# Patient Record
Sex: Male | Born: 1970
Health system: Southern US, Community
[De-identification: ages and names within clinical notes are randomized; demographics above are authoritative.]

## PROBLEM LIST (undated history)

## (undated) DIAGNOSIS — F17200 Nicotine dependence, unspecified, uncomplicated: Secondary | ICD-10-CM

## (undated) DIAGNOSIS — K429 Umbilical hernia without obstruction or gangrene: Secondary | ICD-10-CM

## (undated) DIAGNOSIS — F39 Unspecified mood [affective] disorder: Secondary | ICD-10-CM

## (undated) DIAGNOSIS — T7840XA Allergy, unspecified, initial encounter: Secondary | ICD-10-CM

## (undated) DIAGNOSIS — G510 Bell's palsy: Secondary | ICD-10-CM

## (undated) DIAGNOSIS — Z91030 Bee allergy status: Secondary | ICD-10-CM

## (undated) HISTORY — DX: Bell's palsy: G51.0

## (undated) HISTORY — PX: EYE SURGERY: SHX253

## (undated) HISTORY — DX: Allergy, unspecified, initial encounter: T78.40XA

## (undated) HISTORY — DX: Nicotine dependence, unspecified, uncomplicated: F17.200

## (undated) HISTORY — PX: WRIST SURGERY: SHX841

## (undated) HISTORY — DX: Umbilical hernia without obstruction or gangrene: K42.9

---

## 2001-12-29 ENCOUNTER — Emergency Department (HOSPITAL_COMMUNITY): Admission: EM | Admit: 2001-12-29 | Discharge: 2001-12-29 | Payer: Self-pay

## 2002-09-18 ENCOUNTER — Emergency Department (HOSPITAL_COMMUNITY): Admission: EM | Admit: 2002-09-18 | Discharge: 2002-09-18 | Payer: Self-pay | Admitting: Emergency Medicine

## 2002-09-24 ENCOUNTER — Emergency Department (HOSPITAL_COMMUNITY): Admission: EM | Admit: 2002-09-24 | Discharge: 2002-09-25 | Payer: Self-pay | Admitting: Emergency Medicine

## 2003-03-05 ENCOUNTER — Encounter: Payer: Self-pay | Admitting: Emergency Medicine

## 2003-03-05 ENCOUNTER — Emergency Department (HOSPITAL_COMMUNITY): Admission: EM | Admit: 2003-03-05 | Discharge: 2003-03-05 | Payer: Self-pay | Admitting: Emergency Medicine

## 2003-07-19 ENCOUNTER — Emergency Department (HOSPITAL_COMMUNITY): Admission: EM | Admit: 2003-07-19 | Discharge: 2003-07-19 | Payer: Self-pay | Admitting: Emergency Medicine

## 2005-11-21 ENCOUNTER — Emergency Department (HOSPITAL_COMMUNITY): Admission: EM | Admit: 2005-11-21 | Discharge: 2005-11-21 | Payer: Self-pay | Admitting: Emergency Medicine

## 2006-02-27 ENCOUNTER — Ambulatory Visit: Payer: Self-pay | Admitting: Family Medicine

## 2006-04-10 ENCOUNTER — Ambulatory Visit: Payer: Self-pay | Admitting: Family Medicine

## 2006-04-20 ENCOUNTER — Ambulatory Visit: Payer: Self-pay | Admitting: Family Medicine

## 2006-05-15 ENCOUNTER — Ambulatory Visit: Payer: Self-pay | Admitting: Internal Medicine

## 2006-05-22 ENCOUNTER — Encounter (INDEPENDENT_AMBULATORY_CARE_PROVIDER_SITE_OTHER): Payer: Self-pay | Admitting: Specialist

## 2006-05-22 ENCOUNTER — Ambulatory Visit: Payer: Self-pay | Admitting: Internal Medicine

## 2006-05-22 LAB — HM COLONOSCOPY: HM Colonoscopy: NORMAL

## 2007-02-02 ENCOUNTER — Emergency Department (HOSPITAL_COMMUNITY): Admission: EM | Admit: 2007-02-02 | Discharge: 2007-02-02 | Payer: Self-pay | Admitting: Emergency Medicine

## 2007-02-26 ENCOUNTER — Ambulatory Visit: Payer: Self-pay | Admitting: Family Medicine

## 2007-10-08 ENCOUNTER — Ambulatory Visit: Payer: Self-pay | Admitting: Family Medicine

## 2007-12-13 ENCOUNTER — Ambulatory Visit: Payer: Self-pay | Admitting: Family Medicine

## 2009-06-24 ENCOUNTER — Emergency Department (HOSPITAL_COMMUNITY): Admission: EM | Admit: 2009-06-24 | Discharge: 2009-06-24 | Payer: Self-pay | Admitting: Emergency Medicine

## 2009-11-19 ENCOUNTER — Ambulatory Visit: Payer: Self-pay | Admitting: Family Medicine

## 2009-12-13 ENCOUNTER — Ambulatory Visit: Payer: Self-pay | Admitting: Family Medicine

## 2010-01-11 ENCOUNTER — Ambulatory Visit: Payer: Self-pay | Admitting: Family Medicine

## 2010-05-07 ENCOUNTER — Ambulatory Visit: Payer: Self-pay | Admitting: Family Medicine

## 2010-08-19 ENCOUNTER — Ambulatory Visit: Payer: Self-pay | Admitting: Family Medicine

## 2010-09-13 ENCOUNTER — Ambulatory Visit
Admission: RE | Admit: 2010-09-13 | Discharge: 2010-09-13 | Payer: Self-pay | Source: Home / Self Care | Attending: Family Medicine | Admitting: Family Medicine

## 2010-10-02 ENCOUNTER — Ambulatory Visit
Admission: RE | Admit: 2010-10-02 | Discharge: 2010-10-02 | Payer: Self-pay | Source: Home / Self Care | Attending: Orthopedic Surgery | Admitting: Orthopedic Surgery

## 2010-10-02 LAB — POCT HEMOGLOBIN-HEMACUE: Hemoglobin: 15.3 g/dL (ref 13.0–17.0)

## 2010-10-28 NOTE — Op Note (Signed)
NAME:  Mark Macias, Mark Macias                  ACCOUNT NO.:  [id number]  MEDICAL RECORD NO.:  [id number]          PATIENT TYPE:  AMB  LOCATION:  DSC                          FACILITY:  [facility]  PHYSICIAN:  Cindee Salt, M.D.       DATE OF BIRTH:  04/03/1971  DATE OF PROCEDURE:  10/02/2010 DATE OF DISCHARGE:                              OPERATIVE REPORT   PREOPERATIVE DIAGNOSIS:  Degenerative arthritis, distal radioulnar joint, right wrist.  POSTOPERATIVE DIAGNOSIS:  Degenerative arthritis, distal radioulnar joint, right wrist.  OPERATIONS:  Arthrotomy, synovectomy, debridement of osteophytes, distal ulna, right wrist.  SURGEON:  Cindee Salt, MD  ASSISTANT:  Joaquin Courts, RN  ANESTHESIA:  General.  ANESTHESIOLOGIST:  Bedelia Person, MD  HISTORY:  The patient is a 40 year old male with a history of wrist pain.  This has localized to the distal radioulnar joint.  X-rays revealed degenerative changes.  CT scan reveals the extent of the osteophyte proliferation.  He is admitted now with the plan of debridement in an efficacy if this will give him some time prior to having to have a more definitive procedure including the possibility of a prosthetic replacement which would likely limit his ability to return to full active work.  Pre, peri, and postoperative course has been discussed along with risks and complications.  He is aware that there is no guarantee with surgery, possibility of infection, recurrence, injury to arteries, nerves, tendons, complete relief of symptoms, dystrophy. In the preoperative area, the patient is seen, the extremity marked by both the patient and surgeon, and antibiotic given.  PROCEDURE IN DETAIL:  The patient was brought to the operating room where an axillary block was carried out without difficulty.  He was prepped using ChloraPrep, supine position with the right arm free.  A general anesthetic was also given.  The limb was exsanguinated after a 3- minute dry time.   Time-out taken confirming the patient and procedure. A curvilinear incision was made over the distal radioulnar joint, right wrist and carried down through subcutaneous tissue.  Bleeders were electrocauterized with bipolar.  The dorsal sensory branch of the ulnar nerve was identified and protected.  The dissection was carried down between the fifth and sixth dorsal compartments.  The joint was opened. A large osteophyte formation was present on the dorsal radial aspect of the distal ulna.  The cartilage appeared to be intact, otherwise there was no significant osteophyte formation on the sigmoid notch.  A synovectomy was performed with an osteotome.  The osteophytes were removed.  This was then smoothed with a rongeur.  Pronation and supination revealed no crepitation.  The x-rays confirmed the resection of the osteophyte formation.  The joint was noted be reversed curvature. The fovea appeared to have the ligamentum subcruentum intact and as such, this was not further explored nor repaired.  The wound was copiously irrigated with saline.  The capsule closed with figure-of- eight 4-0 Vicryl sutures after installation of 1 mL of Depo-Medrol.  The interval between the fourth and fifth dorsal compartment was then closed with 2-0 Vicryl sutures, the subcutaneous tissue with interrupted 2-0 Vicryl,  and the skin with 4-0 Vicryl Rapide sutures.  A sterile compressive dressing and splint to the wrist was applied.  On deflation of the tourniquet, all fingers immediately pinked.  He was taken to the recovery room for observation in satisfactory condition.  He will be discharged home to return to the [facility] of [place] in 1 week on Percocet.          ______________________________ Cindee Salt, M.D.     GK/MEDQ  D:  10/02/2010  T:  10/03/2010  Job:  [id number]  Electronically Signed by Cindee Salt M.D. on 10/28/2010 02:24:09 PM

## 2011-01-24 NOTE — Assessment & Plan Note (Signed)
[facility]                           GASTROENTEROLOGY OFFICE NOTE   NAME:Kronick, Atwood D                         MRN:          [id number]  DATE:05/15/2006                            DOB:          1970/10/31    CHIEF COMPLAINT:  Bloody diarrhea.   ASSESSMENT:  A 40 year old white man with chronic diarrhea symptoms, rectal  bleeding, hematochezia of unclear etiology.   CBC, C-MET were normal on April 20, 2006.  There is a family history of  colon polyps in his mother.   He has  history of chronic reflux symptomatology.  He is now off proton pump  inhibitor which previously controlled this.  I had seen him in March 2004.  Upper GI endoscopy for intermittent dysphagia had shown an esophageal  stricture with a non-obstructing ring.  His dysphagia symptoms resolved on  proton pump inhibitor.  He has no dysphagia at this time.   PLAN:  1. Restart a proton pump inhibitor, Prilosec OTC either 1-2 each day to      control symptoms is recommended today.  All of his other PPIs are prior      authorization, so if this works we can continue; if not we will proceed      with prescription PPI.  2. Schedule colonoscopy to investigate the bleeding and diarrhea.  Risks,      benefits, and indications are explained to the patient.  He understands      and agrees to proceed.  This will be performed on May 22, 2006 at      3 p.m.  Inflammatory bowel disease is certainly in the differential.   HISTORY:  As above.  A 40 year old white man has daily loose watery stools,  passing blood and clots.  He has bloating and gas constantly.  He does not  describe much in the way of abdominal pain.  He has not noted anything like  hemorrhoids.  There have been no constitutional symptoms such as fever,  weight loss, or anything like that.  His full review of systems is outlined  in my medical history form.   MEDICATIONS:  1. Alprazolam 0.5 mg twice daily.  2. Sertraline  200 mg every morning.   DRUG ALLERGIES:  NONE KNOWN.   PAST MEDICAL HISTORY:  1. Anxiety and panic disorder.  2. Allergic rhinosinusitis.  3. Gastroesophageal reflux disease.  4. Hiatal hernia.  5. Duodenitis.  6. Gastritis.  7. Non-obstructing esophageal stricture with dysphagia symptoms that      resolved on PPI.   FAMILY HISTORY:  Our review is negative other than those things mentioned  above, though he does indicate there is a strong family history of cancer, I  cannot detect an obvious family history of colon cancer.   SOCIAL HISTORY:  He is a smoker.  He lives with his wife, 2 sons.  Shipping  and receiving supervisor.  Pharmacy:  [facility] on [address].   REVIEW OF SYSTEMS:  Additionally, note that in the past he had straining to  stool and some constipation but no bleeding was described  when I saw him in  2004.   PHYSICAL EXAMINATION:  GENERAL:  Reveals a pleasant well developed white man  in no acute distress.  VITAL SIGNS:  Height 5 feet 10 inches, weight 156 pounds, blood pressure  112/64, pulse 60.  EYES:  Anicteric.  ENT:  Normal mouth, posterior pharynx.  NECK:  Supple.  No thyromegaly or mass.  CHEST:  Clear.  HEART:  S1 S2.  No rubs or gallops.  ABDOMEN:  Soft nontender without organomegaly or mass.  RECTAL:  Deferred.  LYMPHATIC:  No neck or supraclavicular nodes.  EXTREMITIES:  No edema.  SKIN:  No rash.  NEUROLOGIC:  He is alert and oriented x3.   I appreciate the opportunity to care for this patient.                                   Iva Boop, MD,FACG   CEG/MedQ  DD:  05/16/2006  DT:  05/16/2006  Job #:  [id number]   cc:   Sharlot Gowda, M.D.

## 2011-04-07 ENCOUNTER — Ambulatory Visit (INDEPENDENT_AMBULATORY_CARE_PROVIDER_SITE_OTHER): Payer: BC Managed Care – PPO | Admitting: Family Medicine

## 2011-04-07 ENCOUNTER — Encounter: Payer: Self-pay | Admitting: Family Medicine

## 2011-04-07 VITALS — BP 112/80 | HR 80 | Temp 98.3°F | Ht 71.0 in | Wt 157.0 lb

## 2011-04-07 DIAGNOSIS — J410 Simple chronic bronchitis: Secondary | ICD-10-CM

## 2011-04-07 DIAGNOSIS — F17200 Nicotine dependence, unspecified, uncomplicated: Secondary | ICD-10-CM

## 2011-04-07 DIAGNOSIS — Z720 Tobacco use: Secondary | ICD-10-CM

## 2011-04-07 DIAGNOSIS — J40 Bronchitis, not specified as acute or chronic: Secondary | ICD-10-CM

## 2011-04-07 MED ORDER — AMOXICILLIN 875 MG PO TABS: 875.0000 mg | ORAL_TABLET | Freq: Two times a day (BID) | ORAL | Status: AC

## 2011-04-07 NOTE — Patient Instructions (Signed)
Take all the antibiotic and if not entirely better, call me

## 2011-04-07 NOTE — Progress Notes (Signed)
  Subjective:    Patient ID: Mark Macias, male    DOB: [birth year], 40 y.o.   MRN: [id number]  HPI He has a full week history of sinus congestion, ductus cough and ear. Some chills but no sore throat or fever. He continues to smoke and has not ready to quit   Review of Systems     Objective:   Physical Exam alert and in no distress. Tympanic membranes and canals are normal. Throat is clear. Tonsils are normal. Neck is supple without adenopathy or thyromegaly. Cardiac exam shows a regular sinus rhythm without murmurs or gallops. Lungs are clear to auscultation.        Assessment & Plan:  Bronchhitis. Smoker Amoxicillin. Call if not entirely better

## 2011-04-18 ENCOUNTER — Encounter: Payer: Self-pay | Admitting: Family Medicine

## 2011-08-19 ENCOUNTER — Emergency Department (INDEPENDENT_AMBULATORY_CARE_PROVIDER_SITE_OTHER)
Admission: EM | Admit: 2011-08-19 | Discharge: 2011-08-19 | Disposition: A | Discharge status: Home / Self Care | Payer: Self-pay | Source: Home / Self Care | Attending: Family Medicine | Admitting: Family Medicine

## 2011-08-19 ENCOUNTER — Encounter (HOSPITAL_COMMUNITY): Payer: Self-pay | Admitting: *Deleted

## 2011-08-19 DIAGNOSIS — R6889 Other general symptoms and signs: Secondary | ICD-10-CM

## 2011-08-19 DIAGNOSIS — J111 Influenza due to unidentified influenza virus with other respiratory manifestations: Secondary | ICD-10-CM

## 2011-08-19 HISTORY — DX: Unspecified mood (affective) disorder: F39

## 2011-08-19 MED ORDER — IBUPROFEN 600 MG PO TABS: 600.0000 mg | ORAL_TABLET | Freq: Four times a day (QID) | ORAL | Status: AC | PRN

## 2011-08-19 MED ORDER — FLUTICASONE PROPIONATE 50 MCG/ACT NA SUSP: 2.0000 | Freq: Every day | NASAL | Status: DC

## 2011-08-19 MED ORDER — PSEUDOEPHEDRINE-GUAIFENESIN ER 120-1200 MG PO TB12: 1.0000 | ORAL_TABLET | Freq: Two times a day (BID) | ORAL | Status: DC | PRN

## 2011-08-19 MED ORDER — HYDROCODONE-HOMATROPINE 5-1.5 MG/5ML PO SYRP: 5.0000 mL | ORAL_SOLUTION | Freq: Four times a day (QID) | ORAL | Status: DC | PRN

## 2011-08-19 MED ORDER — ALBUTEROL SULFATE HFA 108 (90 BASE) MCG/ACT IN AERS: 1.0000 | INHALATION_SPRAY | Freq: Four times a day (QID) | RESPIRATORY_TRACT | Status: DC | PRN

## 2011-08-19 NOTE — ED Notes (Signed)
Pt  Has  Symptoms  Of  Cough /  Body  Aches    Fever  /  Congestion      X  2  Days            He is  Awake  As  Well as  Alert  Has  Had  Nasal  stuffyness also

## 2011-08-19 NOTE — ED Provider Notes (Signed)
History     CSN: [id number] Arrival date & time: 08/19/2011  2:40 PM   First MD Initiated Contact with Patient 08/19/11 1322      Chief Complaint  Patient presents with  . Cough    HPI Comments: HPI : Flu symptoms for about 2 day. Fever to 101 with chills, sweats, myalgias, fatigue, headache, rhinorrhea, sinus pressure. Symptoms are progressively worsening, despite trying OTC fever reducing medicine and rest and fluids. Has decreased appetite, but tolerating some liquids by mouth. No history of recent tick bite.  Review of Systems: Positive for fatigue, mild nasal congestion, mild sore throat, mild swollen anterior neck glands, mild cough. Negative for acute vision changes, stiff neck, focal weakness, syncope, seizures, respiratory distress, vomiting, diarrhea, GU symptoms.   Patient is a 40 y.o. male presenting with cough.  Cough    Past Medical History  Diagnosis Date  . Allergy     RHINITIS  . Smoker   . Periumbilical hernia   . Mood disorder     Past Surgical History  Procedure Date  . Eye surgery   . Hernia repair     Family History  Problem Relation Age of Onset  . Hypertension Mother   . Coronary artery disease Father     History  Substance Use Topics  . Smoking status: Current Everyday Smoker -- 1.0 packs/day    Types: Cigarettes  . Smokeless tobacco: Never Used  . Alcohol Use: No      Review of Systems  Respiratory: Positive for cough.     Allergies  Review of patient's allergies indicates no known allergies.  Home Medications   Current Outpatient Rx  Name Route Sig Dispense Refill  . CITALOPRAM HYDROBROMIDE 10 MG PO TABS Oral Take 10 mg by mouth daily.      . ALBUTEROL SULFATE HFA 108 (90 BASE) MCG/ACT IN AERS Inhalation Inhale 1-2 puffs into the lungs every 6 (six) hours as needed for wheezing. 1 Inhaler 0  . FLUTICASONE PROPIONATE 50 MCG/ACT NA SUSP Nasal Place 2 sprays into the nose daily. 16 g 2  . HYDROCODONE-HOMATROPINE 5-1.5  MG/5ML PO SYRP Oral Take 5 mLs by mouth every 6 (six) hours as needed for cough or pain. 120 mL 0  . IBUPROFEN 600 MG PO TABS Oral Take 1 tablet (600 mg total) by mouth every 6 (six) hours as needed for pain. 30 tablet 0  . PSEUDOEPHEDRINE-GUAIFENESIN [phone] MG PO TB12 Oral Take 1 tablet by mouth 2 (two) times daily as needed (congestion). 20 each 0    BP 117/65  Pulse 69  Temp(Src) 98.5 F (36.9 C) (Oral)  Resp 12  SpO2 100%  Physical Exam  Nursing note and vitals reviewed. Constitutional: He is oriented to person, place, and time. He appears well-developed and well-nourished.  HENT:  Head: Normocephalic and atraumatic.  Right Ear: Hearing, tympanic membrane and ear canal normal.  Left Ear: Hearing, tympanic membrane and ear canal normal.  Nose: Mucosal edema and rhinorrhea present. No epistaxis. Right sinus exhibits no maxillary sinus tenderness and no frontal sinus tenderness. Left sinus exhibits no maxillary sinus tenderness and no frontal sinus tenderness.  Mouth/Throat: Uvula is midline and mucous membranes are normal. Posterior oropharyngeal erythema present. No oropharyngeal exudate.       No sinus tenderness  Eyes: Conjunctivae and EOM are normal. Pupils are equal, round, and reactive to light.  Neck: Normal range of motion. Neck supple.       Shotty LN bilaterally  Cardiovascular: Normal  rate, regular rhythm and normal heart sounds.   Pulmonary/Chest: Effort normal and breath sounds normal. No respiratory distress.  Abdominal: Soft. Bowel sounds are normal. He exhibits no distension. There is no tenderness.  Musculoskeletal: Normal range of motion. He exhibits no edema and no tenderness.  Neurological: He is alert and oriented to person, place, and time.  Skin: Skin is warm and dry. No rash noted.  Psychiatric: He has a normal mood and affect. His behavior is normal. Judgment and thought content normal.    ED Course  Procedures (including critical care time)  Labs  Reviewed - No data to display No results found.   1. Flu-like symptoms       MDM    Luiz Blare, MD 08/19/11 [phone]

## 2011-08-29 ENCOUNTER — Emergency Department (HOSPITAL_COMMUNITY)
Admission: EM | Admit: 2011-08-29 | Discharge: 2011-08-29 | Disposition: A | Discharge status: Home / Self Care | Payer: Self-pay | Attending: Emergency Medicine | Admitting: Emergency Medicine

## 2011-08-29 ENCOUNTER — Encounter (HOSPITAL_COMMUNITY): Payer: Self-pay | Admitting: *Deleted

## 2011-08-29 ENCOUNTER — Emergency Department (HOSPITAL_COMMUNITY): Payer: Self-pay

## 2011-08-29 DIAGNOSIS — W010XXA Fall on same level from slipping, tripping and stumbling without subsequent striking against object, initial encounter: Secondary | ICD-10-CM | POA: Insufficient documentation

## 2011-08-29 DIAGNOSIS — M545 Low back pain, unspecified: Secondary | ICD-10-CM | POA: Insufficient documentation

## 2011-08-29 DIAGNOSIS — F17200 Nicotine dependence, unspecified, uncomplicated: Secondary | ICD-10-CM | POA: Insufficient documentation

## 2011-08-29 DIAGNOSIS — W19XXXA Unspecified fall, initial encounter: Secondary | ICD-10-CM

## 2011-08-29 DIAGNOSIS — M549 Dorsalgia, unspecified: Secondary | ICD-10-CM | POA: Insufficient documentation

## 2011-08-29 MED ORDER — OXYCODONE-ACETAMINOPHEN 5-325 MG PO TABS
1.0000 | ORAL_TABLET | Freq: Once | ORAL | Status: AC
  Administered 2011-08-29: 1 via ORAL
  Filled 2011-08-29: qty 1

## 2011-08-29 MED ORDER — HYDROCODONE-ACETAMINOPHEN 5-325 MG PO TABS: 2.0000 | ORAL_TABLET | ORAL | Status: AC | PRN

## 2011-08-29 NOTE — ED Provider Notes (Signed)
History     CSN: [id number]  Arrival date & time 08/29/11  1128   First MD Initiated Contact with Patient 08/29/11 1549      Chief Complaint  Patient presents with  . Fall  . Flank Pain  . Back Pain    (Consider location/radiation/quality/duration/timing/severity/associated sxs/prior treatment) Patient is a 40 y.o. male presenting with fall, flank pain, and back pain. The history is provided by the patient. No language interpreter was used.  Fall The accident occurred yesterday. The fall occurred while walking. He fell from a height of 1 to 2 ft. He landed on a hard floor. There was no blood loss. The point of impact was the right hip. The pain is present in the right hip (R flank). The pain is at a severity of 6/10. The pain is moderate. He was ambulatory at the scene. There was no drug use involved in the accident. There was no alcohol use involved in the accident. Pertinent negatives include no numbness, no abdominal pain, no headaches, no loss of consciousness and no tingling. The symptoms are aggravated by sitting. He has tried NSAIDs for the symptoms. The treatment provided mild relief.  Flank Pain Pertinent negatives include no abdominal pain, headaches or numbness.  Back Pain  Pertinent negatives include no numbness, no headaches, no abdominal pain and no tingling.    Past Medical History  Diagnosis Date  . Allergy     RHINITIS  . Smoker   . Periumbilical hernia   . Mood disorder     Past Surgical History  Procedure Date  . Eye surgery   . Hernia repair     Family History  Problem Relation Age of Onset  . Hypertension Mother   . Coronary artery disease Father     History  Substance Use Topics  . Smoking status: Current Everyday Smoker -- 1.0 packs/day    Types: Cigarettes  . Smokeless tobacco: Never Used  . Alcohol Use: No      Review of Systems  Gastrointestinal: Negative for abdominal pain.  Genitourinary: Positive for flank pain.    Musculoskeletal: Positive for back pain.  Neurological: Negative for tingling, loss of consciousness, numbness and headaches.  All other systems reviewed and are negative.    Allergies  Review of patient's allergies indicates no known allergies.  Home Medications   Current Outpatient Rx  Name Route Sig Dispense Refill  . ALBUTEROL SULFATE HFA 108 (90 BASE) MCG/ACT IN AERS Inhalation Inhale 1-2 puffs into the lungs every 6 (six) hours as needed for wheezing. 1 Inhaler 0  . CITALOPRAM HYDROBROMIDE 10 MG PO TABS Oral Take 10 mg by mouth daily.      Marland Kitchen FLUTICASONE PROPIONATE 50 MCG/ACT NA SUSP Nasal Place 2 sprays into the nose daily. 16 g 2  . HYDROCODONE-HOMATROPINE 5-1.5 MG/5ML PO SYRP Oral Take 5 mLs by mouth every 6 (six) hours as needed for cough or pain. 120 mL 0  . IBUPROFEN 600 MG PO TABS Oral Take 1 tablet (600 mg total) by mouth every 6 (six) hours as needed for pain. 30 tablet 0  . PSEUDOEPHEDRINE-GUAIFENESIN [phone] MG PO TB12 Oral Take 1 tablet by mouth 2 (two) times daily as needed (congestion). 20 each 0    BP 115/63  Pulse 55  Temp(Src) 97.7 F (36.5 C) (Oral)  Resp 18  Wt 165 lb (74.844 kg)  SpO2 100%  Physical Exam  Nursing note and vitals reviewed. Constitutional: He is oriented to person, place, and time. He  appears well-developed and well-nourished.       Awake, alert, nontoxic appearance  HENT:  Head: Normocephalic and atraumatic.  Eyes: Right eye exhibits no discharge. Left eye exhibits no discharge.  Neck: Normal range of motion. Neck supple.  Cardiovascular: Normal rate and regular rhythm.   Pulmonary/Chest: Effort normal. He exhibits no tenderness.  Abdominal: There is no tenderness. There is no rebound and no CVA tenderness.  Musculoskeletal: Normal range of motion. He exhibits no tenderness.       Right hip: Normal.       Lumbar back: Normal.       Baseline ROM, no obvious new focal weakness  Neurological: He is alert and oriented to person,  place, and time. He displays normal reflexes.       Mental status and motor strength appears baseline for patient and situation  Skin: No rash noted.     Psychiatric: He has a normal mood and affect.    ED Course  Procedures (including critical care time)  Labs Reviewed - No data to display No results found.   No diagnosis found.    MDM  Pt fell and hits R flank and R hip on a wooden step.  No LOC, or head trauma.  No abd pain, urinary or bowel incontinence, and no caudal equina sxs.  No midline spine tenderness.  Able to ambulate.  If L-spine xray neg, will discharge.    4:39 PM Pt request not to have an xray at this time.  I give red flags signs and recommended f/u if his sxs worsen.  Pt voice understanding. He has no obvious signs suggestive of bony fractures or sciatica.          Fayrene Helper, PA 08/29/11 1734

## 2011-08-29 NOTE — ED Notes (Signed)
Pt states "I was coming back in from smoking last night, the steps were slick and I fell hitting the right side of my back, I have pain running down my right leg"

## 2011-08-29 NOTE — ED Provider Notes (Signed)
Medical screening examination/treatment/procedure(s) were performed by non-physician practitioner and as supervising physician I was immediately available for consultation/collaboration.  Shylo Dillenbeck, MD 08/29/11 2359 

## 2012-01-27 ENCOUNTER — Emergency Department (INDEPENDENT_AMBULATORY_CARE_PROVIDER_SITE_OTHER): Payer: Self-pay

## 2012-01-27 ENCOUNTER — Emergency Department (HOSPITAL_COMMUNITY)
Admission: EM | Admit: 2012-01-27 | Discharge: 2012-01-27 | Disposition: A | Discharge status: Home / Self Care | Payer: Self-pay | Source: Home / Self Care | Attending: Family Medicine | Admitting: Family Medicine

## 2012-01-27 ENCOUNTER — Encounter (HOSPITAL_COMMUNITY): Payer: Self-pay | Admitting: Emergency Medicine

## 2012-01-27 DIAGNOSIS — IMO0002 Reserved for concepts with insufficient information to code with codable children: Secondary | ICD-10-CM

## 2012-01-27 MED ORDER — DICLOFENAC SODIUM 1 % TD GEL: TRANSDERMAL | Status: DC

## 2012-01-27 NOTE — ED Notes (Signed)
C/o right elbow pain.  Incident Friday of having to swing self out of the path of a moving vehicle at work.  Grabbed a bar with right hand and swung self full weight with this arm.  Felt pain at the time of incident.  Denies this being workers comp.  Since incident has had intermittent numbness in right fingers.  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

## 2012-01-27 NOTE — ED Provider Notes (Signed)
History     CSN: [id number]  Arrival date & time 01/27/12  1002   First MD Initiated Contact with Patient 01/27/12 1047      Chief Complaint  Patient presents with  . Elbow Pain    (Consider location/radiation/quality/duration/timing/severity/associated sxs/prior treatment) Patient is a 41 y.o. male presenting with arm injury. The history is provided by the patient and the spouse.  Arm Injury  The incident occurred more than 2 days ago. The incident occurred at work. The injury mechanism was a pulled muscle (grabbed onto bar and swung himself with body weight , felt medial elbow pain. ). There is an injury to the right elbow. The pain is mild.    Past Medical History  Diagnosis Date  . Allergy     RHINITIS  . Smoker   . Periumbilical hernia   . Mood disorder     Past Surgical History  Procedure Date  . Eye surgery   . Wrist surgery     Family History  Problem Relation Age of Onset  . Hypertension Mother   . Coronary artery disease Father     History  Substance Use Topics  . Smoking status: Current Everyday Smoker -- 1.0 packs/day    Types: Cigarettes  . Smokeless tobacco: Never Used  . Alcohol Use: No      Review of Systems  Constitutional: Negative.     Allergies  Review of patient's allergies indicates no known allergies.  Home Medications   Current Outpatient Rx  Name Route Sig Dispense Refill  . IBUPROFEN 800 MG PO TABS Oral Take 800 mg by mouth every 8 (eight) hours as needed.    . ALBUTEROL SULFATE HFA 108 (90 BASE) MCG/ACT IN AERS Inhalation Inhale 1-2 puffs into the lungs every 6 (six) hours as needed for wheezing. 1 Inhaler 0  . CITALOPRAM HYDROBROMIDE 10 MG PO TABS Oral Take 20 mg by mouth 2 (two) times daily.     Marland Kitchen DICLOFENAC SODIUM 1 % TD GEL  Apply 4 gm to elbow qid  ----please instruct in dosing. 3 Tube 0  . FLUTICASONE PROPIONATE 50 MCG/ACT NA SUSP Nasal Place 2 sprays into the nose daily. 16 g 2    BP 104/51  Pulse 62  Temp(Src)  97.7 F (36.5 C) (Oral)  Resp 16  SpO2 99%  Physical Exam  Nursing note and vitals reviewed. Constitutional: He is oriented to person, place, and time. He appears well-developed and well-nourished.  Musculoskeletal: He exhibits tenderness.       Right elbow: He exhibits normal range of motion, no swelling and no effusion. tenderness found. Medial epicondyle tenderness noted.       Arms: Neurological: He is alert and oriented to person, place, and time.  Skin: Skin is warm and dry.    ED Course  Procedures (including critical care time)  Labs Reviewed - No data to display Dg Elbow Complete Right  01/27/2012  *RADIOLOGY REPORT*  Clinical Data: Medial elbow pain following injury 4 days ago.  RIGHT ELBOW - COMPLETE 3+ VIEW  Comparison: None.  Findings: The mineralization and alignment are normal.  There is no evidence of acute fracture or dislocation.  There is spurring of the coronoid process.  No elbow joint effusion or focal soft tissue swelling is evident.  IMPRESSION: No acute osseous findings or significant elbow joint effusion.  Original Report Authenticated By: Gerrianne Scale, M.D.     1. Strain of right elbow or forearm, initial encounter  MDM  X-rays reviewed and report per radiologist.         Linna Hoff, MD 01/27/12 1323

## 2012-01-27 NOTE — Discharge Instructions (Signed)
Ice and medication as needed, see orthopedist if further problems °

## 2012-09-02 ENCOUNTER — Telehealth: Payer: Self-pay | Admitting: Family Medicine

## 2012-09-02 ENCOUNTER — Ambulatory Visit (INDEPENDENT_AMBULATORY_CARE_PROVIDER_SITE_OTHER): Payer: Self-pay | Admitting: Family Medicine

## 2012-09-02 ENCOUNTER — Encounter: Payer: Self-pay | Admitting: Family Medicine

## 2012-09-02 VITALS — BP 120/80 | HR 69 | Temp 98.3°F | Wt 169.0 lb

## 2012-09-02 DIAGNOSIS — K089 Disorder of teeth and supporting structures, unspecified: Secondary | ICD-10-CM

## 2012-09-02 DIAGNOSIS — K0889 Other specified disorders of teeth and supporting structures: Secondary | ICD-10-CM

## 2012-09-02 MED ORDER — LIDOCAINE VISCOUS 2 % MT SOLN: 20.0000 mL | OROMUCOSAL | Status: DC | PRN

## 2012-09-02 NOTE — Progress Notes (Signed)
CALLED IN VISCUS SILO.PER JCL

## 2012-09-02 NOTE — Telephone Encounter (Signed)
Check with the pharmacist and see if he has something equivalent or have him get it at a different drugstore

## 2012-09-02 NOTE — Telephone Encounter (Signed)
GEL THAT YOU PRESCRIBED, WAS NOT IN STOCK AT PHARMACY, WANTS DIFFERENT Rx  Please call patient

## 2012-09-02 NOTE — Telephone Encounter (Signed)
Called pt asked if he wanted to use different pharm he said no called [facility] tried to talk with pharmacist could not get through left message to please give him something comparable per [facility]

## 2012-09-02 NOTE — Progress Notes (Signed)
  Subjective:    Patient ID: Mark Macias, male    DOB: 1971-08-05, 41 y.o.   MRN: [id number]  HPI  he complains of a several week history of left canine pain. He does have a cavity in that and when he eats or gets food. In it, he has a lot of pain.   Review of Systems     Objective:   Physical Exam Exam of his mouth does show a large cavity present in the left canine, surrounding tissue is not erythematous. No tenderness palpation to the mucosa. No adenopathy noted.       Assessment & Plan:   1. Toothache    I will have him use Viscous Xylocaine on an as-needed basis. Cautioned him on the pain with the initial use of the Xylocaine.

## 2012-09-02 NOTE — Patient Instructions (Signed)
Use of viscus Xylocaine as needed for the tooth pain the tooth pain.

## 2012-09-07 ENCOUNTER — Encounter (HOSPITAL_COMMUNITY): Payer: Self-pay | Admitting: *Deleted

## 2012-09-07 ENCOUNTER — Emergency Department (INDEPENDENT_AMBULATORY_CARE_PROVIDER_SITE_OTHER)
Admission: EM | Admit: 2012-09-07 | Discharge: 2012-09-07 | Disposition: A | Discharge status: Home / Self Care | Payer: Self-pay | Source: Home / Self Care

## 2012-09-07 DIAGNOSIS — K029 Dental caries, unspecified: Secondary | ICD-10-CM

## 2012-09-07 DIAGNOSIS — K0889 Other specified disorders of teeth and supporting structures: Secondary | ICD-10-CM

## 2012-09-07 DIAGNOSIS — K089 Disorder of teeth and supporting structures, unspecified: Secondary | ICD-10-CM

## 2012-09-07 MED ORDER — HYDROCODONE-ACETAMINOPHEN 7.5-325 MG PO TABS: ORAL_TABLET | ORAL | Status: DC

## 2012-09-07 MED ORDER — AMOXICILLIN 500 MG PO CAPS: ORAL_CAPSULE | ORAL | Status: DC

## 2012-09-07 NOTE — ED Provider Notes (Signed)
History     CSN: [id number]  Arrival date & time 09/07/12  1401   None     Chief Complaint  Patient presents with  . Dental Pain    (Consider location/radiation/quality/duration/timing/severity/associated sxs/prior treatment) HPI Comments: 41 year old male states he has an abscessed tooth. States it began approximately 3 weeks ago. He points to an upper left molar. This tooth is severely decayed with destruction of the enamel, surface and pulp. Patient states he has been relieving pressure of what he thought to be an abscess by using a heated pin and creating a puncture wound to allow to drain.   Past Medical History  Diagnosis Date  . Allergy     RHINITIS  . Smoker   . Periumbilical hernia   . Mood disorder     Past Surgical History  Procedure Date  . Eye surgery   . Wrist surgery     Family History  Problem Relation Age of Onset  . Hypertension Mother   . Coronary artery disease Father     History  Substance Use Topics  . Smoking status: Current Every Day Smoker -- 1.0 packs/day    Types: Cigarettes  . Smokeless tobacco: Never Used  . Alcohol Use: No      Review of Systems  All other systems reviewed and are negative.    Allergies  Review of patient's allergies indicates no known allergies.  Home Medications   Current Outpatient Rx  Name  Route  Sig  Dispense  Refill  . CITALOPRAM HYDROBROMIDE 10 MG PO TABS   Oral   Take 20 mg by mouth 2 (two) times daily.          Marland Kitchen DICLOFENAC SODIUM 1 % TD GEL      Apply 4 gm to elbow qid  ----please instruct in dosing.   3 Tube   0   . ALBUTEROL SULFATE HFA 108 (90 BASE) MCG/ACT IN AERS   Inhalation   Inhale 1-2 puffs into the lungs every 6 (six) hours as needed for wheezing.   1 Inhaler   0   . AMOXICILLIN 500 MG PO CAPS      2 caps bid for 7 days   28 capsule   0   . BUSPIRONE HCL 10 MG PO TABS   Oral   Take 10 mg by mouth 2 (two) times daily.         Marland Kitchen HYDROCODONE-ACETAMINOPHEN  7.5-325 MG PO TABS      1 tablet every 4 hours as needed for tooth pain.   Label Do not drive or operate heavy machinery while taking med.   20 tablet   0   . IBUPROFEN 800 MG PO TABS   Oral   Take 800 mg by mouth every 8 (eight) hours as needed.         Marland Kitchen LIDOCAINE VISCOUS 2 % MT SOLN   Oral   Take 20 mLs by mouth as needed for pain.   100 mL   0     BP 111/85  Pulse 60  Temp 98.6 F (37 C) (Oral)  Resp 16  SpO2 100%  Physical Exam  Constitutional: He is oriented to person, place, and time. He appears well-developed and well-nourished. No distress.  HENT:       Upper left molar with decay instructions as described in history of present illness. No evidence of abscess at this time.  Neck: Normal range of motion. Neck supple.  Pulmonary/Chest: Effort normal.  Lymphadenopathy:    He has no cervical adenopathy.  Neurological: He is alert and oriented to person, place, and time.  Skin: Skin is warm and dry.    ED Course  Procedures (including critical care time)  Labs Reviewed - No data to display No results found.   1. Pain, dental   2. Caries involving multiple surfaces of tooth       MDM  Norco 7.5 one every 4 hours when necessary pain. #20. Do not take while driving operating heavy machinery. Amoxicillin as directed A see dentist as soon as possible. 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                           Hayden Rasmussen, NP 09/07/12 1710

## 2012-09-07 NOTE — ED Notes (Signed)
Pt reports abscess tooth for the past 3 weeks - no dental insurance

## 2012-09-09 NOTE — ED Provider Notes (Signed)
Medical screening examination/treatment/procedure(s) were performed by resident physician or non-physician practitioner and as supervising physician I was immediately available for consultation/collaboration.   Avina Eberle DOUGLAS MD.    Kippy Gohman D Maelyn Berrey, MD 09/09/12 2039 

## 2013-11-18 ENCOUNTER — Ambulatory Visit (INDEPENDENT_AMBULATORY_CARE_PROVIDER_SITE_OTHER): Payer: BC Managed Care – PPO | Admitting: Medical

## 2013-11-18 ENCOUNTER — Encounter: Payer: Self-pay | Admitting: Medical

## 2013-11-18 VITALS — BP 120/70 | HR 80 | Temp 98.1°F | Resp 16 | Wt 167.0 lb

## 2013-11-18 DIAGNOSIS — H9190 Unspecified hearing loss, unspecified ear: Secondary | ICD-10-CM

## 2013-11-18 DIAGNOSIS — J329 Chronic sinusitis, unspecified: Secondary | ICD-10-CM

## 2013-11-18 DIAGNOSIS — R059 Cough, unspecified: Secondary | ICD-10-CM

## 2013-11-18 DIAGNOSIS — H6690 Otitis media, unspecified, unspecified ear: Secondary | ICD-10-CM

## 2013-11-18 DIAGNOSIS — R05 Cough: Secondary | ICD-10-CM

## 2013-11-18 MED ORDER — AMOXICILLIN-POT CLAVULANATE 875-125 MG PO TABS: 1.0000 | ORAL_TABLET | Freq: Two times a day (BID) | ORAL | Status: DC

## 2013-11-18 NOTE — Progress Notes (Signed)
Subjective:  Mark Macias is a 43 y.o. male who presents for possible sinus infection.  Symptoms include several day hx/o sinus drainage, stuffy head nose and ears, constantly blowing nose, body aches, cough and chest congestion, felt some chills yesterday.  Denies fever, NVD, no SOB or wheezing.   Past history is significant for sinus infection. Patient is a smoker.  Using Nyquil, antihistamine for symptoms.  + sick contacts.  Wife says he is always hard of hearing.  No other aggravating or relieving factors.  No other c/o.  ROS as in subjective   Objective: Filed Vitals:   11/18/13 0933  BP: 120/70  Pulse: 80  Temp: 98.1 F (36.7 C)  Resp: 16    General appearance: Alert, WD/WN, no distress                             Skin: warm, no rash                           Head: +mild frontal sinus tenderness,                            Eyes: conjunctiva normal, corneas clear, PERRLA                            Ears: erythema of both TMs, scarring of both TMs from prior tubes, and purulent effusion bilat, external ear canals normal                          Nose: septum midline, turbinates swollen, with erythema and clear discharge             Mouth/throat: MMM, tongue normal, mild pharyngeal erythema                           Neck: supple, no adenopathy, no thyromegaly, nontender                          Heart: RRR, normal S1, S2, no murmurs                         Lungs: CTA bilaterally, no wheezes, rales, or rhonchi      Assessment and Plan: Encounter Diagnoses  Name Primary?  . Cough Yes  . Otitis media   . Sinusitis   . Hearing decreased      Prescription given for Augmentin.  Can c/t OTC decongestant.  Tylenol or Ibuprofen OTC for fever and malaise.  Discussed symptomatic relief, nasal saline flush, and call or return if worse or not improving in 2-3 days.    F/u in 10-14 days including hearing test next visit.

## 2013-11-21 ENCOUNTER — Telehealth: Payer: Self-pay | Admitting: Medical

## 2013-11-21 ENCOUNTER — Other Ambulatory Visit: Payer: Self-pay | Admitting: Medical

## 2013-11-21 MED ORDER — BENZONATATE 200 MG PO CAPS: 200.0000 mg | ORAL_CAPSULE | Freq: Three times a day (TID) | ORAL | Status: DC | PRN

## 2013-11-21 NOTE — Telephone Encounter (Signed)
i would stay the course with Augmentin, give it more time.  Can use OTC mucinex or similar for congestion.  I sent Tessalon Perles cough medication.  However, chart says he has norco.  Thus, can use Norco pain medication for cough which is a good cough suppressant or the Tessalon I sent.

## 2013-11-21 NOTE — Telephone Encounter (Signed)
Patient is aware of Mark Macias [facility] message. CLS He is also aware of the medication sent to the pharmacy. CLS

## 2013-11-30 ENCOUNTER — Telehealth: Payer: Self-pay | Admitting: Medical

## 2013-11-30 NOTE — Telephone Encounter (Signed)
Last visit both ears seem infection.   Is he some better, no better, worse?  If no better at all, have him come in.  Let me know

## 2013-11-30 NOTE — Telephone Encounter (Signed)
Patient is aware and he as an appointment for 12/01/13. CLS

## 2013-11-30 NOTE — Telephone Encounter (Signed)
He fells some what better but still has cough and congestion. He said it feels like it is related to allergy's. CLS

## 2013-11-30 NOTE — Telephone Encounter (Signed)
Last visit I asked him to return to make sure the bilateral ear infection cleared up.  Thus have him come back for recheck on that, however if he feels like his current symptoms are allergy related, make sure he is taking Claritin or Zyrtec daily

## 2013-11-30 NOTE — Telephone Encounter (Signed)
Pt called and stated that he is just not much better. He states his ears are still really bothering him. Pt uses [facility] in [place].

## 2013-12-01 ENCOUNTER — Encounter: Payer: Self-pay | Admitting: Medical

## 2013-12-01 ENCOUNTER — Ambulatory Visit (INDEPENDENT_AMBULATORY_CARE_PROVIDER_SITE_OTHER): Payer: BC Managed Care – PPO | Admitting: Medical

## 2013-12-01 VITALS — BP 118/80 | HR 60 | Temp 98.0°F | Resp 16 | Wt 168.0 lb

## 2013-12-01 DIAGNOSIS — J309 Allergic rhinitis, unspecified: Secondary | ICD-10-CM

## 2013-12-01 DIAGNOSIS — H6690 Otitis media, unspecified, unspecified ear: Secondary | ICD-10-CM

## 2013-12-01 DIAGNOSIS — R059 Cough, unspecified: Secondary | ICD-10-CM

## 2013-12-01 DIAGNOSIS — R05 Cough: Secondary | ICD-10-CM

## 2013-12-01 MED ORDER — AZELASTINE-FLUTICASONE 137-50 MCG/ACT NA SUSP: 1.0000 | Freq: Two times a day (BID) | NASAL | Status: DC

## 2013-12-01 MED ORDER — AMOXICILLIN-POT CLAVULANATE 875-125 MG PO TABS: 1.0000 | ORAL_TABLET | Freq: Two times a day (BID) | ORAL | Status: DC

## 2013-12-01 MED ORDER — HYDROCODONE-HOMATROPINE 5-1.5 MG/5ML PO SYRP: 5.0000 mL | ORAL_SOLUTION | Freq: Three times a day (TID) | ORAL | Status: DC | PRN

## 2013-12-01 NOTE — Patient Instructions (Signed)
  Thank you for giving me the opportunity to serve you today.    Your diagnosis today includes: Encounter Diagnoses  Name Primary?  . Allergic rhinitis Yes  . Otitis media   . Cough      Specific recommendations today include:  For the next 7 days, use another round of Augmentin  Make sure you are drinking plenty of water  Begin Dymista nasal 1 spray per nostril twice daily  For this week continue Claritin D, but after things are doing better, switch to Allegra, or Zyrtec, or Claritin every day x 1-2 months for allergies  You may use Hycodan syrup for cough   Follow up: call back in 1 week to let me know how this is doing

## 2013-12-01 NOTE — Progress Notes (Signed)
Subjective:  Mark Macias is a 43 y.o. male who presents for recheck.  I saw him about 11/18/13 for initial visit for this.  Doing some better, but still has stuffy head nose and ears, runny nose, sneezing, cough and chest congestion, still with ear fullness.  Denies fever, NVD, no SOB or wheezing.   Past history is significant for sinus infection. Patient is a smoker.  Using Claritin D.  Used the recent Augmentin, tessalon Perles didn't help.  Wife says he is always hard of hearing.  No other aggravating or relieving factors.  No other c/o.   ROS as in subjective   Objective: Filed Vitals:   12/01/13 0915  BP: 118/80  Pulse: 60  Temp: 98 F (36.7 C)  Resp: 16    General appearance: Alert, WD/WN, no distress                             Skin: warm, no rash                           Head: no sinus tenderness                            Eyes: conjunctiva normal, corneas clear, PERRLA                            Ears: mild erythema of right TMs, scarring of both TMs from prior tubes, and purulent effusion right, external ear canals normal                          Nose: septum midline, turbinates swollen, with erythema and clear discharge             Mouth/throat: MMM, tongue normal, mild pharyngeal erythema                           Neck: supple, no adenopathy, no thyromegaly, nontender                          Heart: RRR, normal S1, S2, no murmurs                         Lungs: CTA bilaterally, no wheezes, rales, or rhonchi      Assessment and Plan: Encounter Diagnoses  Name Primary?  . Allergic rhinitis Yes  . Otitis media   . Cough    Discussed symptoms, concerns.  Specific recommendations today include:  For the next 7 days, use another round of Augmentin  Make sure you are drinking plenty of water  Begin Dymista nasal 1 spray per nostril twice daily  For this week continue Claritin D, but after things are doing better, switch to Allegra, or Zyrtec, or Claritin every day x  1-2 months for allergies  You may use Hycodan syrup for cough  F/u with call back in 1 week.

## 2014-02-09 ENCOUNTER — Telehealth: Payer: Self-pay | Admitting: Family Medicine

## 2014-02-09 NOTE — Telephone Encounter (Signed)
[name] from our patients attorney [employer] [phone] called and states if we get a request for records from [facility] not to give them any records.  I explained we have letter stating statute and [name] advised it was only for treating physician for the accident of 07/25/13 for injury to his back and that we did not treat him for that injury.

## 2014-02-15 ENCOUNTER — Telehealth: Payer: Self-pay | Admitting: Internal Medicine

## 2014-02-15 NOTE — Telephone Encounter (Signed)
Faxed over medical records to [facility] @ [phone]

## 2014-03-14 ENCOUNTER — Ambulatory Visit (HOSPITAL_COMMUNITY): Payer: Self-pay

## 2014-03-14 ENCOUNTER — Emergency Department (HOSPITAL_COMMUNITY)
Admission: EM | Admit: 2014-03-14 | Discharge: 2014-03-14 | Disposition: A | Discharge status: Home / Self Care | Payer: Self-pay | Source: Home / Self Care | Attending: Emergency Medicine | Admitting: Emergency Medicine

## 2014-03-14 ENCOUNTER — Ambulatory Visit (HOSPITAL_COMMUNITY)
Admission: RE | Admit: 2014-03-14 | Discharge: 2014-03-14 | Disposition: A | Discharge status: Home / Self Care | Payer: Self-pay | Source: Ambulatory Visit | Attending: Emergency Medicine | Admitting: Emergency Medicine

## 2014-03-14 ENCOUNTER — Encounter (HOSPITAL_COMMUNITY): Payer: Self-pay | Admitting: Emergency Medicine

## 2014-03-14 DIAGNOSIS — M79609 Pain in unspecified limb: Secondary | ICD-10-CM | POA: Insufficient documentation

## 2014-03-14 DIAGNOSIS — S93402A Sprain of unspecified ligament of left ankle, initial encounter: Secondary | ICD-10-CM

## 2014-03-14 DIAGNOSIS — M7989 Other specified soft tissue disorders: Secondary | ICD-10-CM | POA: Insufficient documentation

## 2014-03-14 DIAGNOSIS — S93409A Sprain of unspecified ligament of unspecified ankle, initial encounter: Secondary | ICD-10-CM

## 2014-03-14 HISTORY — DX: Bee allergy status: Z91.030

## 2014-03-14 NOTE — ED Provider Notes (Signed)
Chief Complaint   Chief Complaint  Patient presents with  . Ankle Pain    History of Present Illness   Mark Macias is a 43 year old male who twisted his left ankle today try to get away from a yellow jacket. He did not hear a pop. As the ankle swelled, particularly over the lateral malleolus. He's able to walk without much pain and to move the ankle without much pain.  Review of Systems   Other than as noted above, the patient denies any of the following symptoms: Systemic:  No fevers or chills.   Musculoskeletal:  No joint pain or swelling, back pain, or neck pain. Neurological:  No muscular weakness or paresthesias.  [place]   Past medical history, family history, social history, meds, and allergies were reviewed.   Physical Examination     Vital signs:  BP 128/80  Pulse 60  Temp(Src) 98.1 F (36.7 C) (Oral)  Resp 14  SpO2 100% Gen:  Alert and oriented times 3.  In no distress. Musculoskeletal: Exam of the ankle reveals swelling over the lateral malleolus. It's not much pain to palpation. Anterior drawer sign negative.  Talar tilt negative. Squeeze test positive. Achilles tendon, peroneal tendon, and tibialis posterior were intact. Otherwise, all joints had a full a ROM with no swelling, bruising or deformity.  No edema, pulses full. Extremities were warm and pink.  Capillary refill was brisk.  Skin:  Clear, warm and dry.  No rash. Neuro:  Alert and oriented times 3.  Muscle strength was normal.  Sensation was intact to light touch.   Radiology   Dg Ankle Complete Left  03/14/2014   CLINICAL DATA:  Left ankle swelling, pain.  EXAM: LEFT ANKLE COMPLETE - 3+ VIEW  COMPARISON:  None.  FINDINGS: Severe diffuse soft tissue swelling about the ankle, most pronounced laterally. Irregular bony fragment noted near the base of the cuboid on the lateral view. This is not visualized on the additional images. No additional bony abnormality. Joint spaces are maintained.  IMPRESSION:  Irregular bone density near the base of the cuboid on the lateral view only. Consider further evaluation with dedicated foot series.   Electronically Signed   By: Rolm Baptise M.D.   On: 03/14/2014 21:52   Dg Foot Complete Left  03/14/2014   CLINICAL DATA:  Left foot injury, pain, and swelling.  EXAM: LEFT FOOT - COMPLETE 3+ VIEW  COMPARISON:  None.  FINDINGS: There is no evidence of fracture or dislocation. There is no evidence of arthropathy or other focal bone abnormality. Soft tissue calcifications are seen along the plantar aspect of midfoot, likely due to chronic trauma  IMPRESSION: No acute findings.  Plantar soft tissue calcifications, likely due to chronic trauma and of doubtful clinical significance.   Electronically Signed   By: Earle Gell M.D.   On: 03/14/2014 22:46   I reviewed the images independently and personally and concur with the radiologist's findings.  Course in Urgent [place]   The patient was placed in an air cast and Ace wrap.  Assessment   The encounter diagnosis was Ankle sprain, left, initial encounter.  He was advised to rest, elevate, apply ice for the first 3 days, then begin ankle exercises. Return again if no better in 2 weeks.  Plan     1.  Meds:  The following meds were prescribed:   New Prescriptions   No medications on file    2.  Patient Education/Counseling:  The patient was given  appropriate handouts, self care instructions, including rest and activity, elevation, application of ice and compression, and instructed in symptomatic relief.    3.  Follow up:  The patient was told to follow up here if no better in 2 weeks, or sooner if becoming worse in any way, and given some red flag symptoms such as increasing pain or neurological symptoms which would prompt immediate return.  Follow up here if necessary.     Harden Mo, MD 03/14/14 [phone]

## 2014-03-14 NOTE — Discharge Instructions (Signed)
An ankle sprain is a tear or stretch to one or more of the ligaments that surround and support the ankle joint.  This can take time to improve, but by following some of the hints below, you can speed the healing time.  For the first 72 hours, follow the principles of RICE (Rest, Ice, Compression, and Elevation).  Rest--Stay off the ankle as much as possible.  If you have been provided with crutches, use them.  Ice--Apply ice (crushed ice in a zip lock bag, frozen peas or corn, or one of the commercial blue gel ice packs that you put in the freezer).  Apply every 2 or 3 hours if possible, while awake.  If you are wearing a brace or splint, you may apply the ice right over the splint.  If you do not have a splint or brace, place a moist washcloth or towel between the ice and your skin to avoid damage to your skin.  You may leave the ice in place for 10 to 15 minutes.  Alternatively, you can do ice massage by filling a paper cup half full of water, inserting a popsicle stick and freezing it.  This gives you a chunk of ice on a stick with which you can massage the painful area.  Compression--If you have been provided with an ankle brace, wear it whenever you are on your feet.  This means you can take it off at night when you sleep or when you shower or bathe.  Otherwise, it should be on your ankle.  If you were not given a brace, you should wear a 3 or 4 inch Ace wrap.  Wind it around your ankle in a figure 8 pattern at full stretch.  You may need to rewrap the ankle several times a day to keep the pressure up.  If your toes feel numb, painful, or look pale or blue, loosen it up a little since this could be a sign of circulatory impairment.  An Ace wrap loses its elasticity after 3 or 4 days of wear, so you will need to replace it.  Elevation--Elevate your ankle above heart level every chance you get.  This means propping it up on several pillows.  Use of over the counter pain meds can be of help.  Tylenol  (or acetaminophen) is the safest to use.  It often helps to take this regularly.  You can take up to 2 325 mg tablets 5 times daily, but it best to start out much lower that that, perhaps 2 325 mg tablets twice daily, then increase from there. People who are on the blood thinner warfarin have to be careful about taking high doses of Tylenol.  For people who are able to tolerate them, ibuprofen and naproxyn can also help with the pain.  You should discuss these agents with your physician before taking them.  People with chronic kidney disease, hypertension, peptic ulcer disease, and reflux can suffer adverse side effects. They should not be taken with warfarin. The maximum dosage of ibuprofen is 800 mg 3 times daily with meals.  The maximum dosage of naprosyn is 2 and 1/2 tablets twice daily with food, but again, start out low and gradually increase the dose until adequate pain relief is achieved. Ibuprofen and naprosyn should always be taken with food.  After the first 72 hours, it's time to start mobilizing the ankle.  Ankle sprains heal quicker with gentle and gradual remobilization.  You may now begin to bear more  weight as pain allows.  If you are using crutches, you may begin to wean off the crutches if you can.  Don't start to do any strenuous exercises such as running or sports until cleared by a physician.  It is best to start out with some simple exercises such as those described below and then gradually build up.  Do the exercises twice daily followed by ice for 10 minutes.  Continue to wear your brace or Ace wrap until the ankle has completely healed plus one or 2 more weeks.   ANKLE CIRCLES   Sit on the floor with your legs in front of you. Move your ankle from side to side, up and down, and around in circles. Do five to 10 circles in each direction at least three times a day.  ALPHABET LETTERS   Using your big toe as a pencil, try to write the letters of the alphabet in the air. Do the  entire alphabet two or three times.  TOE RAISES   Pull your toes back toward you while keeping your knee as straight as you can. Hold for 15 seconds. Do this 10 times.  HEEL RAISES   Point your toes away from you while keeping your knee as straight as you can. Hold for 15 seconds. Do this 10 times.  IN AND OUT   Turn your foot inward until you can't turn it anymore and hold for 15 seconds. Straighten your leg again. Turn it outward until you can't turn it anymore and hold for 15 seconds. Do this 10 times in both directions.  RESISTED IN AND OUT   Sit on a chair with your leg straight in front of you. Tie a large elastic exercise band together at one end to make a knot. Wrap the knot end of the band around a chair leg and the other end around the bottom of your injured foot. Keep your heel on the ground and slide your foot outward and hold for 10 seconds. Put your foot in front of you again. Slide your foot inward and hold for 10 seconds. Repeat at least 10 times each direction two or three times a day.  STEP-UP   Put your injured foot on the first step of a staircase and your uninjured foot on the ground. Slowly straighten the knee of your injured leg while lifting your uninjured foot off of the ground. Slowly put your uninjured foot back on the ground. Do this three to five times at least three times a day.  SITTING AND STANDING HEEL RAISES   Sit in a chair with your injured foot on the ground. Slowly raise the heel of your injured foot while keeping your toes on the ground. Return the heel to the floor. Repeat 10 times at least two or three times a day. As you get stronger, you can stand on your injured foot instead of sitting in a chair and raise the heel. Your uninjured foot should always stay on the ground.  BALANCE EXERCISES   Stand and place a chair next to your uninjured leg to balance you. At first, stand on the injured foot for only 30 seconds. You can slowly increase this to up  to three minutes at a time. Repeat at least three times a day. For more difficulty, repeat with your eyes closed.  You will need to continue these exercises until your ankle is completely pain free, then for 1 to 2 more weeks.  If your ankle does not  seem to be making much progress after 2 weeks, it is best to return to have it rechecked, although an ankle sprain can take up to 6 weeks to heal.

## 2014-03-14 NOTE — ED Notes (Signed)
Patient transported to X-ray at hospital by shuttle.

## 2014-03-14 NOTE — ED Notes (Signed)
Twisted L ankle @ 1830 trying to get away from yellow jackets. Hx. Allergies to bee stings.  He got stung on L ankle and R lower leg.  Took  2 Benadryl @ 3403.  No SOB or rash. Has localized swelling at stings.  C/o pain and swelling to L ankle.  Is able to bear wt.

## 2014-03-14 NOTE — ED Notes (Signed)
Return from xray

## 2015-08-29 ENCOUNTER — Encounter: Payer: Self-pay | Admitting: Internal Medicine

## 2015-09-28 ENCOUNTER — Ambulatory Visit: Payer: Self-pay | Admitting: Family Medicine

## 2015-10-05 ENCOUNTER — Encounter: Payer: Self-pay | Admitting: Family Medicine

## 2015-12-11 DIAGNOSIS — M47817 Spondylosis without myelopathy or radiculopathy, lumbosacral region: Secondary | ICD-10-CM | POA: Diagnosis not present

## 2015-12-18 DIAGNOSIS — M47817 Spondylosis without myelopathy or radiculopathy, lumbosacral region: Secondary | ICD-10-CM | POA: Diagnosis not present

## 2016-01-29 ENCOUNTER — Ambulatory Visit (INDEPENDENT_AMBULATORY_CARE_PROVIDER_SITE_OTHER): Payer: BLUE CROSS/BLUE SHIELD | Admitting: Family Medicine

## 2016-01-29 ENCOUNTER — Encounter: Payer: Self-pay | Admitting: Family Medicine

## 2016-01-29 VITALS — BP 122/72 | HR 69 | Wt 174.4 lb

## 2016-01-29 DIAGNOSIS — M549 Dorsalgia, unspecified: Secondary | ICD-10-CM

## 2016-01-29 DIAGNOSIS — R6882 Decreased libido: Secondary | ICD-10-CM | POA: Diagnosis not present

## 2016-01-29 DIAGNOSIS — F17200 Nicotine dependence, unspecified, uncomplicated: Secondary | ICD-10-CM

## 2016-01-29 DIAGNOSIS — R5382 Chronic fatigue, unspecified: Secondary | ICD-10-CM | POA: Diagnosis not present

## 2016-01-29 DIAGNOSIS — F329 Major depressive disorder, single episode, unspecified: Secondary | ICD-10-CM

## 2016-01-29 DIAGNOSIS — Z720 Tobacco use: Secondary | ICD-10-CM | POA: Diagnosis not present

## 2016-01-29 DIAGNOSIS — G8929 Other chronic pain: Secondary | ICD-10-CM | POA: Diagnosis not present

## 2016-01-29 DIAGNOSIS — F32A Depression, unspecified: Secondary | ICD-10-CM

## 2016-01-29 LAB — COMPREHENSIVE METABOLIC PANEL
ALT: 25 U/L (ref 9–46)
AST: 20 U/L (ref 10–40)
Albumin: 4.4 g/dL (ref 3.6–5.1)
Alkaline Phosphatase: 99 U/L (ref 40–115)
BUN: 10 mg/dL (ref 7–25)
CO2: 25 mmol/L (ref 20–31)
Calcium: 9.7 mg/dL (ref 8.6–10.3)
Chloride: 104 mmol/L (ref 98–110)
Creat: 1.04 mg/dL (ref 0.60–1.35)
Glucose, Bld: 75 mg/dL (ref 65–99)
Potassium: 4 mmol/L (ref 3.5–5.3)
Sodium: 139 mmol/L (ref 135–146)
Total Bilirubin: 0.5 mg/dL (ref 0.2–1.2)
Total Protein: 6.7 g/dL (ref 6.1–8.1)

## 2016-01-29 LAB — CBC WITH DIFFERENTIAL/PLATELET
Basophils Absolute: 0 cells/uL (ref 0–200)
Basophils Relative: 0 %
Eosinophils Absolute: 318 cells/uL (ref 15–500)
Eosinophils Relative: 3 %
HCT: 46.7 % (ref 38.5–50.0)
Hemoglobin: 15.8 g/dL (ref 13.2–17.1)
Lymphocytes Relative: 21 %
Lymphs Abs: 2226 cells/uL (ref 850–3900)
MCH: 30.1 pg (ref 27.0–33.0)
MCHC: 33.8 g/dL (ref 32.0–36.0)
MCV: 89 fL (ref 80.0–100.0)
MPV: 10.7 fL (ref 7.5–12.5)
Monocytes Absolute: 636 cells/uL (ref 200–950)
Monocytes Relative: 6 %
Neutro Abs: 7420 cells/uL (ref 1500–7800)
Neutrophils Relative %: 70 %
Platelets: 257 10*3/uL (ref 140–400)
RBC: 5.25 MIL/uL (ref 4.20–5.80)
RDW: 13.4 % (ref 11.0–15.0)
WBC: 10.6 10*3/uL — ABNORMAL HIGH (ref 4.0–10.5)

## 2016-01-29 NOTE — Progress Notes (Signed)
   Subjective:    Patient ID: Mark Macias, male    DOB: 02-27-1971, 45 y.o.   MRN: [id number]  HPI  he complains of a 6 month history of decreased libido, energy as well as his strength. He also complains of slight cold intolerance. He has an underlying history of depression and is followed at [facility] for this. This seemed to be fairly stable. He also sees Dr. Patrice Paradise for chronic back pain. He continues to smoke. He has no other concerns or complaints.   Review of Systems     Objective:   Physical Exam Alert and in no distress. Tympanic membranes and canals are normal. Pharyngeal area is normal. Neck is supple without adenopathy or thyromegaly. Cardiac exam shows a regular sinus rhythm without murmurs or gallops. Lungs are clear to auscultation. Skin is normal. DTRs normal.        Assessment & Plan:  Chronic back pain  Depression  Decreased libido - Plan: CBC with Differential/Platelet, Comprehensive metabolic panel, Testosterone, TSH  Chronic fatigue - Plan: CBC with Differential/Platelet, Comprehensive metabolic panel, Testosterone, TSH  Current smoker  will continue on his medications. I will do blood work on him. He at this time is not ready to quit smoking.

## 2016-01-30 ENCOUNTER — Other Ambulatory Visit: Payer: Self-pay

## 2016-01-30 DIAGNOSIS — R7989 Other specified abnormal findings of blood chemistry: Secondary | ICD-10-CM

## 2016-01-30 LAB — TESTOSTERONE: Testosterone: 238 ng/dL — ABNORMAL LOW (ref 250–827)

## 2016-01-30 LAB — TSH: TSH: 1.83 mIU/L (ref 0.40–4.50)

## 2016-01-31 ENCOUNTER — Other Ambulatory Visit: Payer: BLUE CROSS/BLUE SHIELD

## 2016-01-31 DIAGNOSIS — E291 Testicular hypofunction: Secondary | ICD-10-CM | POA: Diagnosis not present

## 2016-01-31 DIAGNOSIS — R7989 Other specified abnormal findings of blood chemistry: Secondary | ICD-10-CM

## 2016-01-31 LAB — TESTOSTERONE: Testosterone: 328 ng/dL (ref 250–827)

## 2016-03-18 ENCOUNTER — Ambulatory Visit (INDEPENDENT_AMBULATORY_CARE_PROVIDER_SITE_OTHER): Payer: BLUE CROSS/BLUE SHIELD | Admitting: Medical

## 2016-03-18 ENCOUNTER — Encounter: Payer: Self-pay | Admitting: Medical

## 2016-03-18 ENCOUNTER — Telehealth: Payer: Self-pay | Admitting: Medical

## 2016-03-18 VITALS — BP 120/80 | HR 76 | Temp 97.8°F | Resp 18 | Ht 70.0 in | Wt 172.4 lb

## 2016-03-18 DIAGNOSIS — B370 Candidal stomatitis: Secondary | ICD-10-CM

## 2016-03-18 DIAGNOSIS — K146 Glossodynia: Secondary | ICD-10-CM

## 2016-03-18 DIAGNOSIS — R070 Pain in throat: Secondary | ICD-10-CM

## 2016-03-18 MED ORDER — FIRST-DUKES MOUTHWASH MT SUSP: 10.0000 mL | Freq: Four times a day (QID) | OROMUCOSAL | Status: DC

## 2016-03-18 NOTE — Telephone Encounter (Signed)
Phoned in and fixed with [facility] and contains all requested ingredients.

## 2016-03-18 NOTE — Telephone Encounter (Signed)
Pt was informed that med was called to pharmacy

## 2016-03-18 NOTE — Telephone Encounter (Signed)
Pt called stating that [facility] told him that Diphenhyd-Hydrocort-Nystatin mouthwash is no longer made so pt will need another med sent in. Pt said he was told that there is another option with lidocaine in it. Pt wants to be called when the new script is sent to the pharmacy.

## 2016-03-18 NOTE — Telephone Encounter (Signed)
pls call [facility] and see what option for Magic Mouthwash they have?  Needs to contain Nystatin, Benadryl and preferably lidocaine.

## 2016-03-18 NOTE — Progress Notes (Addendum)
Subjective: Chief Complaint  Patient presents with  . Sore Throat    has gotten better, mostly complains of tongue pain now  . 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    Delivers medication to nursing homes.  He notes that a bottle of medication fell off a counter, lid busted/broke when it fell off a counter.  He and nurse cleaned up the mess and disposed of the medication.  This was 03/10/16.   He used a paper towel to get the powder residue off his hands.    He thinks the medication was Hydroxyzine but he is not sure.   He notes a few days later he started getting some discomfort on tongue that has persisted til now.   Started getting throat pain the next day.   Thinks he ended up touching his face with his hands out of habit, thinks this caused the pain of the tongue and throat.   Currently he notes tongue pain.   Throat not so painful today.  Thinks it is some better at this point.   Used a medication for thrush, liquid that has lidocaine.  Using this the last few days twice daily.   Denies fever, no congestion, no runny nose, no sneezing, no cough, no dyspnea.  At first had difficulty swallowing but now now.   No sick contacts.  Married 11 years, no concern for STD.   Smokes but no oral tobacco/dipping.   No hx/o cancer, HIV, diabetes.  No other aggravating or relieving factors. No other complaint.  Past Medical History  Diagnosis Date  . Allergy     RHINITIS  . Smoker   . Periumbilical hernia   . Mood disorder ([place])   . Allergy to bee sting     ROS as in subjective   Objective: BP 120/80 mmHg  Pulse 76  Temp(Src) 97.8 F (36.6 C) (Oral)  Resp 18  Ht 5' 10"  (1.778 m)  Wt 172 lb 6.4 oz (78.2 kg)  BMI 24.74 kg/m2  Gen: wd, wn, nad Green whith thick coating on tongue throughout.  otherwise there is some tooth decay,  but no other lesions no erythema TMs bilat with scar tissue, but no erythema, no significant wax HENT otherwise intact Mild tenderness of shoddy nodes of right anterior neck. No other lymphadenopathy Lungs clear  KOH prep of tongue with no obvious yeasts  Assessment: Encounter Diagnoses  Name Primary?  . Tongue pain Yes  . Thrush, oral   . Throat pain     Plan: Possible thrush vs contact dermatitis.   Discussed history and no major concern for underlying illness to cause thrust.  reviewed labs from 01/2016.   Begin Magic Mouthwash.  May take 1-2 weeks to resolve.  Recheck in 2 wk, however call or return if worse in the meantime.

## 2016-04-10 ENCOUNTER — Encounter: Payer: Self-pay | Admitting: Family Medicine

## 2016-04-10 ENCOUNTER — Ambulatory Visit (INDEPENDENT_AMBULATORY_CARE_PROVIDER_SITE_OTHER): Payer: BLUE CROSS/BLUE SHIELD | Admitting: Family Medicine

## 2016-04-10 VITALS — BP 112/80 | HR 70 | Ht 69.0 in | Wt 173.6 lb

## 2016-04-10 DIAGNOSIS — F4323 Adjustment disorder with mixed anxiety and depressed mood: Secondary | ICD-10-CM | POA: Diagnosis not present

## 2016-04-10 DIAGNOSIS — R6882 Decreased libido: Secondary | ICD-10-CM | POA: Diagnosis not present

## 2016-04-10 DIAGNOSIS — R5382 Chronic fatigue, unspecified: Secondary | ICD-10-CM

## 2016-04-10 DIAGNOSIS — E291 Testicular hypofunction: Secondary | ICD-10-CM

## 2016-04-10 DIAGNOSIS — T7840XA Allergy, unspecified, initial encounter: Secondary | ICD-10-CM

## 2016-04-10 DIAGNOSIS — J301 Allergic rhinitis due to pollen: Secondary | ICD-10-CM | POA: Diagnosis not present

## 2016-04-10 DIAGNOSIS — Z0000 Encounter for general adult medical examination without abnormal findings: Secondary | ICD-10-CM | POA: Diagnosis not present

## 2016-04-10 DIAGNOSIS — Z23 Encounter for immunization: Secondary | ICD-10-CM | POA: Diagnosis not present

## 2016-04-10 DIAGNOSIS — Z720 Tobacco use: Secondary | ICD-10-CM | POA: Diagnosis not present

## 2016-04-10 DIAGNOSIS — F17200 Nicotine dependence, unspecified, uncomplicated: Secondary | ICD-10-CM

## 2016-04-10 DIAGNOSIS — M79672 Pain in left foot: Secondary | ICD-10-CM

## 2016-04-10 LAB — POCT URINALYSIS DIPSTICK
Bilirubin, UA: NEGATIVE
Blood, UA: NEGATIVE
Glucose, UA: NEGATIVE
Ketones, UA: NEGATIVE
Leukocytes, UA: NEGATIVE
Nitrite, UA: NEGATIVE
Protein, UA: NEGATIVE
Spec Grav, UA: >=1.03
Urobilinogen, UA: NEGATIVE
pH, UA: 6

## 2016-04-10 MED ORDER — EPINEPHRINE 0.3 MG/0.3ML IJ SOAJ: 0.3000 mg | Freq: Once | INTRAMUSCULAR | 1 refills | Status: AC

## 2016-04-10 NOTE — Progress Notes (Addendum)
Subjective:    Patient ID: Mark Macias, male    DOB: 1971-08-02, 45 y.o.   MRN: [id number]  HPI He is here for complete examination. He has had difficulty with his left foot again. He has had previous ganglion cyst removal and would like to get this taken care of again. He also continues to complain of decreased libido, energy as well as strength. He states that this is been going on for at least 6 months.He has had recent testosterone levels, one was below 300 the other was slightly above it. He continues to be followed by his psychiatrist for an underlying mood disorder. He is doing well on this. His allergies are under good control. He continues to smoke and is not interested in quitting. He doe an allergy to hornets and would like a refill on his EpiPen. He does indeed describe a wheal-and-flare reaction from the hornets. He has no other concerns or complaints. Work in his home life are going well. Family and social history as well as health maintenance and immunizations were reviewed.  Review of Systems  All other systems reviewed and are negative.      Objective:   Physical Exam BP 112/80 (BP Location: Left Arm, Patient Position: Sitting, Cuff Size: Normal)   Pulse 70   Ht 5' 9"  (1.753 m)   Wt 173 lb 9.6 oz (78.7 kg)   BMI 25.64 kg/m   General Appearance:    Alert, cooperative, no distress, appears stated age  Head:    Normocephalic, without obvious abnormality, atraumatic  Eyes:    PERRL, conjunctiva/corneas clear, EOM's intact, fundi    benign  Ears:    Normal TM's and external ear canals  Nose:   Nares normal, mucosa normal, no drainage or sinus   tenderness  Throat:   Lips, mucosa, and tongue normal; teeth and gums normal  Neck:   Supple, no lymphadenopathy;  thyroid:  no   enlargement/tenderness/nodules; no carotid   bruit or JVD  Back:    Spine nontender, no curvature, ROM normal, no CVA     tenderness  Lungs:     Clear to auscultation bilaterally without wheezes, rales  or     ronchi; respirations unlabored  Chest Wall:    No tenderness or deformity   Heart:    Regular rate and rhythm, S1 and S2 normal, no murmur, rub   or gallop  Breast Exam:    No chest wall tenderness, masses or gynecomastia  Abdomen:     Soft, non-tender, nondistended, normoactive bowel sounds,    no masses, no hepatosplenomegaly  Genitalia:    Normal male external genitalia without lesions.  Testicles without masses.  No inguinal hernias.  Rectal:   Deferred  Extremities:   No clubbing, cyanosis or edema. Left foot exam does show surgical scar medially with questionable tenderness proximal to the scarring. Normal motion of the foot.  Pulses:   2+ and symmetric all extremities  Skin:   Skin color, texture, turgor normal, no rashes or lesions  Lymph nodes:   Cervical, supraclavicular, and axillary nodes normal  Neurologic:   CNII-XII intact, normal strength, sensation and gait; reflexes 2+ and symmetric throughout          Psych:   Normal mood, affect, hygiene and grooming.          Assessment & Plan:  Routine general medical examination at a health care facility - Plan: POCT Urinalysis Dipstick, Sex hormone binding globulin  Current smoker  Allergy, initial encounter  Decreased libido - Plan: Testosterone Total,Free,Bio, Males, Sex hormone binding globulin, CANCELED: Testosterone Total,Free,Bio, Males, CANCELED: Sex hormone binding globulin, CANCELED: Sex hormone binding globulin  Chronic fatigue - Plan: Testosterone Total,Free,Bio, Males, Sex hormone binding globulin, CANCELED: Testosterone Total,Free,Bio, Males, CANCELED: Sex hormone binding globulin, CANCELED: Sex hormone binding globulin  Adjustment disorder with mixed anxiety and depressed mood  Allergic rhinitis due to pollen  Need for prophylactic vaccination with combined diphtheria-tetanus-pertussis (DTP) vaccine - Plan: Tdap vaccine greater than or equal to 7yo IM  Left foot pain - Plan: Ambulatory referral to  Orthopedic Surgery He will be referred back to his orthopedic surgeon. Discussed smoking cessation and as mentioned he is not interested in quitting. I will also further evaluate low testosterone since clinically he has these symptoms in spite of testosterone levels being equivocal. He will continue to be followed by psychiatry. EpiPen will be ordered. His testosterone bioavailability is below normal. This was discussed with Mark Macias. He recommended placing him on 50 mg of Clomid daily and recheck his numbers in about 4 months. If Mark Macias a good range, we can then dropping down to 25 mg. He also discussed the possibility of doing estradiol level to see if he is converting his testosterone. I might do this at a later date.

## 2016-04-11 LAB — SEX HORMONE BINDING GLOBULIN: Sex Hormone Binding: 33 nmol/L (ref 10–50)

## 2016-04-11 LAB — TESTOSTERONE TOTAL,FREE,BIO, MALES
Albumin: 4.4 g/dL (ref 3.6–5.1)
Sex Hormone Binding: 34 nmol/L (ref 10–50)
Testosterone, Bioavailable: 103.7 ng/dL — ABNORMAL LOW (ref 130.5–681.7)
Testosterone, Free: 51.5 pg/mL (ref 47.0–244.0)
Testosterone: 401 ng/dL (ref 250–827)

## 2016-04-17 DIAGNOSIS — M25572 Pain in left ankle and joints of left foot: Secondary | ICD-10-CM | POA: Diagnosis not present

## 2016-04-21 ENCOUNTER — Telehealth: Payer: Self-pay

## 2016-04-21 DIAGNOSIS — E291 Testicular hypofunction: Secondary | ICD-10-CM | POA: Insufficient documentation

## 2016-04-21 MED ORDER — CLOMIPHENE CITRATE 50 MG PO TABS: 50.0000 mg | ORAL_TABLET | Freq: Every day | ORAL | 3 refills | Status: DC

## 2016-04-21 NOTE — Telephone Encounter (Signed)
Dr.lalonde I couldn't see your note on his labs

## 2016-04-21 NOTE — Addendum Note (Signed)
Addended by: Denita Lung on: 04/21/2016 04:55 PM   Modules accepted: Orders

## 2016-04-21 NOTE — Telephone Encounter (Signed)
Pt called with additional questions about low testosterone bioavialable in lab results given to him via mychart. Please call pt back at [phone]. Mark Macias

## 2016-04-24 DIAGNOSIS — M25572 Pain in left ankle and joints of left foot: Secondary | ICD-10-CM | POA: Diagnosis not present

## 2016-04-29 DIAGNOSIS — M25572 Pain in left ankle and joints of left foot: Secondary | ICD-10-CM | POA: Diagnosis not present

## 2016-05-14 ENCOUNTER — Other Ambulatory Visit: Payer: Self-pay | Admitting: Orthopedic Surgery

## 2016-05-14 DIAGNOSIS — M722 Plantar fascial fibromatosis: Secondary | ICD-10-CM | POA: Diagnosis not present

## 2016-05-14 DIAGNOSIS — D2122 Benign neoplasm of connective and other soft tissue of left lower limb, including hip: Secondary | ICD-10-CM | POA: Diagnosis not present

## 2016-05-14 DIAGNOSIS — D487 Neoplasm of uncertain behavior of other specified sites: Secondary | ICD-10-CM | POA: Diagnosis not present

## 2016-05-20 DIAGNOSIS — M722 Plantar fascial fibromatosis: Secondary | ICD-10-CM | POA: Diagnosis not present

## 2016-05-20 DIAGNOSIS — M25572 Pain in left ankle and joints of left foot: Secondary | ICD-10-CM | POA: Diagnosis not present

## 2016-05-29 DIAGNOSIS — M25572 Pain in left ankle and joints of left foot: Secondary | ICD-10-CM | POA: Diagnosis not present

## 2016-05-30 DIAGNOSIS — Z23 Encounter for immunization: Secondary | ICD-10-CM | POA: Diagnosis not present

## 2016-06-26 DIAGNOSIS — M722 Plantar fascial fibromatosis: Secondary | ICD-10-CM | POA: Diagnosis not present

## 2016-07-04 ENCOUNTER — Encounter: Payer: Self-pay | Admitting: Medical

## 2016-07-04 ENCOUNTER — Ambulatory Visit (INDEPENDENT_AMBULATORY_CARE_PROVIDER_SITE_OTHER): Payer: BLUE CROSS/BLUE SHIELD | Admitting: Medical

## 2016-07-04 VITALS — BP 130/70 | HR 64 | Temp 98.1°F | Resp 16 | Wt 177.0 lb

## 2016-07-04 DIAGNOSIS — H938X3 Other specified disorders of ear, bilateral: Secondary | ICD-10-CM | POA: Diagnosis not present

## 2016-07-04 DIAGNOSIS — Z9622 Myringotomy tube(s) status: Secondary | ICD-10-CM | POA: Insufficient documentation

## 2016-07-04 DIAGNOSIS — R05 Cough: Secondary | ICD-10-CM

## 2016-07-04 DIAGNOSIS — Z8669 Personal history of other diseases of the nervous system and sense organs: Secondary | ICD-10-CM | POA: Diagnosis not present

## 2016-07-04 DIAGNOSIS — J019 Acute sinusitis, unspecified: Secondary | ICD-10-CM | POA: Diagnosis not present

## 2016-07-04 DIAGNOSIS — F17200 Nicotine dependence, unspecified, uncomplicated: Secondary | ICD-10-CM

## 2016-07-04 DIAGNOSIS — R059 Cough, unspecified: Secondary | ICD-10-CM

## 2016-07-04 MED ORDER — AMOXICILLIN 500 MG PO TABS: ORAL_TABLET | ORAL | 0 refills | Status: DC

## 2016-07-04 NOTE — Addendum Note (Signed)
Addended by: Arley Phenix L on: 07/04/2016 11:52 AM   Modules accepted: Orders

## 2016-07-04 NOTE — Progress Notes (Signed)
Subjective:  Mark Macias is a 45 y.o. male who presents for illness. Chief Complaint  Patient presents with  . Nasal Congestion    onset 2 weeks, has taken zyrtec and sudafed with minimal relief. congestion in nose and ears. cough.  can he take delsym.    He reports 2 week hx/o congestion and cough, congestion in chest and head, ear pressure,   No fever, no sore throat, no NVD.   No sick contacts.   Smoker.    Is interested in restarting Chantix.  Was on this in 2012, helped a lot, but then was lost to f/u.  Was able to stop smoking for 8-10 months.  Denies any side effects of chantix .    Wants ENT referral for chronic ear pressure and congestion.   Sees [facility] for mental health, just recently changed to Zoloft.  Was on Celexa.    No other aggravating or relieving factors.  No other c/o.  Past Medical History:  Diagnosis Date  . Allergy    RHINITIS  . Allergy to bee sting   . Mood disorder ([place])   . Periumbilical hernia   . Smoker    Current Outpatient Prescriptions on File Prior to Visit  Medication Sig Dispense Refill  . BACLOFEN PO Take 50 mg by mouth at bedtime.    . cetirizine (ZYRTEC) 10 MG tablet Take 10 mg by mouth daily.    . clomiPHENE (CLOMID) 50 MG tablet Take 1 tablet (50 mg total) by mouth daily. 30 tablet 3  . citalopram (CELEXA) 10 MG tablet Take 20 mg by mouth 2 (two) times daily.     . Diphenhyd-Hydrocort-Nystatin (FIRST-DUKES MOUTHWASH) SUSP Use as directed 10 mLs in the mouth or throat 4 (four) times daily. (Patient not taking: Reported on 07/04/2016) 237 mL 0  . hydrOXYzine (ATARAX/VISTARIL) 50 MG tablet Take 50 mg by mouth daily. At bed time     No current facility-administered medications on file prior to visit.     ROS as in subjective   Objective: BP 130/70   Pulse 64   Temp 98.1 F (36.7 C) (Oral)   Resp 16   Wt 177 lb (80.3 kg)   SpO2 97%   BMI 26.14 kg/m   General appearance: Alert, WD/WN, no distress  Skin: warm, no rash                           Head: +maxillary sinus tenderness,                            Eyes: conjunctiva normal, corneas clear, PERRLA                            Ears:flatTMs, prior scar tissue of both TMs, external ear canals normal                          Nose: septum midline, turbinates swollen, with erythema and clear discharge             Mouth/throat: MMM, tongue normal, mild pharyngeal erythema                           Neck: supple, no adenopathy, no thyromegaly, non tender  Lungs: CTA bilaterally, no wheezes, rales, or rhonchi      Assessment  Encounter Diagnoses  Name Primary?  . Acute sinusitis, recurrence not specified, unspecified location Yes  . Cough   . Smoker   . Ear fullness, bilateral   . History of placement of ear tubes       Plan: Begin amoxicillin for current symptoms, can use OTC decongestant short term for a week  Referral to ENT for chronic ear pressure, hearing decreased, hx/o tubes in both ears  Smoker - advised he discuss Chantix with psychiatry or Dr. Redmond School after he has been on Zoloft several more weeks.  He just recently changed antidepressants and may not be best time to add Chantix at this time  Zeshan was seen today for nasal congestion.  Diagnoses and all orders for this visit:  Acute sinusitis, recurrence not specified, unspecified location  Cough  Smoker  Ear fullness, bilateral  History of placement of ear tubes  Other orders -     amoxicillin (AMOXIL) 500 MG tablet; 2 tablets po BID x 10 days  f/u prn

## 2016-07-07 ENCOUNTER — Telehealth: Payer: Self-pay | Admitting: Family Medicine

## 2016-07-07 ENCOUNTER — Other Ambulatory Visit: Payer: Self-pay | Admitting: Medical

## 2016-07-07 MED ORDER — PROMETHAZINE-DM 6.25-15 MG/5ML PO SYRP: 5.0000 mL | ORAL_SOLUTION | Freq: Four times a day (QID) | ORAL | 0 refills | Status: DC | PRN

## 2016-07-07 NOTE — Telephone Encounter (Signed)
Send in med to [facility]

## 2016-07-07 NOTE — Telephone Encounter (Signed)
[facility]  [place] [place]    See previous message

## 2016-07-07 NOTE — Telephone Encounter (Signed)
Pt said he is taking the Amoxicillin as instructed but his cough is getting worse

## 2016-07-07 NOTE — Telephone Encounter (Signed)
Call out Promethazine DM cough syrup

## 2016-07-11 DIAGNOSIS — H6983 Other specified disorders of Eustachian tube, bilateral: Secondary | ICD-10-CM | POA: Diagnosis not present

## 2016-07-11 DIAGNOSIS — H9193 Unspecified hearing loss, bilateral: Secondary | ICD-10-CM | POA: Insufficient documentation

## 2016-07-11 DIAGNOSIS — J0141 Acute recurrent pansinusitis: Secondary | ICD-10-CM | POA: Diagnosis not present

## 2016-07-11 DIAGNOSIS — J302 Other seasonal allergic rhinitis: Secondary | ICD-10-CM | POA: Diagnosis not present

## 2016-07-16 ENCOUNTER — Telehealth: Payer: Self-pay | Admitting: Medical

## 2016-07-16 NOTE — Telephone Encounter (Signed)
Pt called and states that he is not feeling any better and is still coughing and that is would like something else sent in, like a zpack, states he should be feeling better, the cough medicine helps but just for a short time, [id number] [place] [facility] [address] And pt can be reached at [phone] any questions

## 2016-07-17 ENCOUNTER — Other Ambulatory Visit: Payer: Self-pay | Admitting: Medical

## 2016-07-17 MED ORDER — AZITHROMYCIN 250 MG PO TABS: ORAL_TABLET | ORAL | 0 refills | Status: DC

## 2016-07-17 MED ORDER — AZITHROMYCIN 250 MG PO TABS
ORAL_TABLET | ORAL | 0 refills | Status: DC
Start: 2016-07-17 — End: 2016-07-17

## 2016-07-17 NOTE — Telephone Encounter (Signed)
I sent Zpak.  If not much improved within 5-7 days, then recheck

## 2016-07-17 NOTE — Telephone Encounter (Signed)
Called and l/m to let him know about zpack and follow up US in 7 days if he is not better

## 2016-07-29 ENCOUNTER — Ambulatory Visit (INDEPENDENT_AMBULATORY_CARE_PROVIDER_SITE_OTHER): Payer: BLUE CROSS/BLUE SHIELD | Admitting: Medical

## 2016-07-29 ENCOUNTER — Encounter: Payer: Self-pay | Admitting: Medical

## 2016-07-29 VITALS — BP 124/70 | HR 70 | Temp 98.2°F | Wt 175.0 lb

## 2016-07-29 DIAGNOSIS — F17200 Nicotine dependence, unspecified, uncomplicated: Secondary | ICD-10-CM | POA: Diagnosis not present

## 2016-07-29 DIAGNOSIS — R062 Wheezing: Secondary | ICD-10-CM

## 2016-07-29 DIAGNOSIS — R05 Cough: Secondary | ICD-10-CM | POA: Diagnosis not present

## 2016-07-29 DIAGNOSIS — R059 Cough, unspecified: Secondary | ICD-10-CM

## 2016-07-29 MED ORDER — HYDROCODONE-HOMATROPINE 5-1.5 MG/5ML PO SYRP: 5.0000 mL | ORAL_SOLUTION | Freq: Three times a day (TID) | ORAL | 0 refills | Status: DC | PRN

## 2016-07-29 MED ORDER — PREDNISONE 10 MG PO TABS: ORAL_TABLET | ORAL | 0 refills | Status: DC

## 2016-07-29 MED ORDER — ALBUTEROL SULFATE HFA 108 (90 BASE) MCG/ACT IN AERS: 2.0000 | INHALATION_SPRAY | Freq: Four times a day (QID) | RESPIRATORY_TRACT | 0 refills | Status: DC | PRN

## 2016-07-29 NOTE — Progress Notes (Signed)
Subjective: Chief Complaint  Patient presents with  . couging , and cold    coughing and cold    Here for f/u.  I saw him 07/04/16 for sinusitis and ear pressure.    He ended up seeing ENT 07/11/16 for ear issues, was given sea salt spray for nose and another treatment.  Currently lots of coughing, ears feel stopped up again, runny nose, feels wheezy at times.   No head or sinus pressure.   No NVD.   No productive cough.   No itchy or water eyes.  No itchy nose.  Some sneezing.  At this point he completed 2 different antibiotic, amoxicillin and zpak since 07/04/16.  He is a smoker.   Taking chantix currently.   Wife still smokes though which makes it difficult for him to quit.  Denies recurrent bronchitis.  No prior inhaler use.   He has been doing some yard work, mowing up leaves.   Has a golden retrieve dog in the house long term.   He has been a 1 ppd smoker x 22 years, but recently cut down to 1/2 ppd, trying to quit.  No other aggravating or relieving factors. No other complaint.  Past Medical History:  Diagnosis Date  . Allergy    RHINITIS  . Allergy to bee sting   . Mood disorder ([place])   . Periumbilical hernia   . Smoker    Current Outpatient Prescriptions on File Prior to Visit  Medication Sig Dispense Refill  . cetirizine (ZYRTEC) 10 MG tablet Take 10 mg by mouth daily.    . sertraline (ZOLOFT) 50 MG tablet   1  . traZODone (DESYREL) 50 MG tablet   0  . BACLOFEN PO Take 50 mg by mouth at bedtime.    . clomiPHENE (CLOMID) 50 MG tablet Take 1 tablet (50 mg total) by mouth daily. 30 tablet 3   No current facility-administered medications on file prior to visit.    ROS as in subjective   Objective:  BP 124/70   Pulse 70   Temp 98.2 F (36.8 C)   Wt 175 lb (79.4 kg)   SpO2 98%   BMI 25.84 kg/m   General appearance: Alert, WD/WN, no distress, coughing                             Skin: warm, no rash, no diaphoresis                           Head: no sinus tenderness                      Eyes: conjunctiva normal, corneas clear, PERRLA                            Ears: scar tissue bilat TMs, but no erythema, external ear canals normal                          Nose: septum midline, turbinates swollen, with erythema and clear discharge             Mouth/throat: MMM, tongue normal, mild pharyngeal erythema                           Neck: supple, no adenopathy, no thyromegaly,  non tender                          Heart: RRR, normal S1, S2, no murmurs                         Lungs: +bronchial breath sounds, no rhonchi, no wheezes, no rales                Extremities: no edema, non tender      Assessment: Encounter Diagnoses  Name Primary?  . Cough Yes  . Wheezing   . Smoker       Plan: PFT reviewed and was normal.  Glad to hear he is trying to quit tobacco.  symptoms today suggest residual cough, inflammation, and possible allergic bronchitis given working out in the yard, mowing up leaves.   Medications below, go for xray to rule out other.   Call/return if not completley resolved within a week.  Mark Macias was seen today for couging , and cold.  Diagnoses and all orders for this visit:  Cough -     DG Chest 2 View; Future -     predniSONE (DELTASONE) 10 MG tablet; 6/5/4/3/2/1 -     HYDROcodone-homatropine (HYCODAN) 5-1.5 MG/5ML syrup; Take 5 mLs by mouth every 8 (eight) hours as needed for cough. -     albuterol (PROVENTIL HFA;VENTOLIN HFA) 108 (90 Base) MCG/ACT inhaler; Inhale 2 puffs into the lungs every 6 (six) hours as needed for wheezing or shortness of breath. -     Spirometry with graph  Wheezing -     DG Chest 2 View; Future -     predniSONE (DELTASONE) 10 MG tablet; 6/5/4/3/2/1 -     HYDROcodone-homatropine (HYCODAN) 5-1.5 MG/5ML syrup; Take 5 mLs by mouth every 8 (eight) hours as needed for cough. -     albuterol (PROVENTIL HFA;VENTOLIN HFA) 108 (90 Base) MCG/ACT inhaler; Inhale 2 puffs into the lungs every 6 (six) hours as needed for  wheezing or shortness of breath. -     Spirometry with graph  Smoker -     DG Chest 2 View; Future -     predniSONE (DELTASONE) 10 MG tablet; 6/5/4/3/2/1 -     HYDROcodone-homatropine (HYCODAN) 5-1.5 MG/5ML syrup; Take 5 mLs by mouth every 8 (eight) hours as needed for cough. -     albuterol (PROVENTIL HFA;VENTOLIN HFA) 108 (90 Base) MCG/ACT inhaler; Inhale 2 puffs into the lungs every 6 (six) hours as needed for wheezing or shortness of breath. -     Spirometry with graph

## 2016-08-18 ENCOUNTER — Telehealth: Payer: Self-pay | Admitting: Family Medicine

## 2016-08-18 NOTE — Telephone Encounter (Signed)
Called to patient to know that he need to get a chest x-ray , he said that he will go in morning to have this done.

## 2016-08-18 NOTE — Telephone Encounter (Signed)
At this point, for persistent cough, needs to go for chest xray.   Order is in, please have him go for CXR.

## 2016-08-18 NOTE — Telephone Encounter (Signed)
Pt called and states Cough not better.  Please call him in something for cough to [facility] [address] Pt ph [phone]

## 2016-08-19 ENCOUNTER — Other Ambulatory Visit: Payer: Self-pay | Admitting: Medical

## 2016-08-19 ENCOUNTER — Other Ambulatory Visit: Payer: Self-pay

## 2016-08-19 ENCOUNTER — Ambulatory Visit
Admission: RE | Admit: 2016-08-19 | Discharge: 2016-08-19 | Disposition: A | Discharge status: Home / Self Care | Payer: BLUE CROSS/BLUE SHIELD | Source: Ambulatory Visit | Attending: Medical | Admitting: Medical

## 2016-08-19 DIAGNOSIS — R05 Cough: Secondary | ICD-10-CM | POA: Diagnosis not present

## 2016-08-19 DIAGNOSIS — R059 Cough, unspecified: Secondary | ICD-10-CM

## 2016-08-19 DIAGNOSIS — F17200 Nicotine dependence, unspecified, uncomplicated: Secondary | ICD-10-CM

## 2016-08-19 DIAGNOSIS — R062 Wheezing: Secondary | ICD-10-CM

## 2016-08-19 MED ORDER — RANITIDINE HCL 150 MG PO TABS: 150.0000 mg | ORAL_TABLET | Freq: Two times a day (BID) | ORAL | 0 refills | Status: DC

## 2016-08-19 MED ORDER — BENZONATATE 100 MG PO CAPS: 100.0000 mg | ORAL_CAPSULE | Freq: Two times a day (BID) | ORAL | 0 refills | Status: DC | PRN

## 2016-08-19 MED ORDER — BENZONATATE 200 MG PO CAPS: 200.0000 mg | ORAL_CAPSULE | Freq: Three times a day (TID) | ORAL | 0 refills | Status: DC | PRN

## 2016-08-27 ENCOUNTER — Other Ambulatory Visit: Payer: Self-pay

## 2016-08-27 ENCOUNTER — Telehealth: Payer: Self-pay | Admitting: Family Medicine

## 2016-08-27 MED ORDER — OSELTAMIVIR PHOSPHATE 75 MG PO CAPS: 75.0000 mg | ORAL_CAPSULE | Freq: Every day | ORAL | 0 refills | Status: DC

## 2016-08-27 NOTE — Telephone Encounter (Signed)
Let him know that I called in Tamiflu

## 2016-08-27 NOTE — Telephone Encounter (Signed)
Pt called and states that his mother in law and his wife has the flu and he is wondering if you would send him something in to help prevent him from getting it, pt can be reached at [phone] and [id number] [place]

## 2016-10-06 DIAGNOSIS — M47816 Spondylosis without myelopathy or radiculopathy, lumbar region: Secondary | ICD-10-CM | POA: Diagnosis not present

## 2016-10-23 ENCOUNTER — Ambulatory Visit (INDEPENDENT_AMBULATORY_CARE_PROVIDER_SITE_OTHER): Payer: BLUE CROSS/BLUE SHIELD | Admitting: Family Medicine

## 2016-10-23 ENCOUNTER — Encounter: Payer: Self-pay | Admitting: Family Medicine

## 2016-10-23 VITALS — BP 126/78 | HR 68 | Ht 69.0 in | Wt 172.8 lb

## 2016-10-23 DIAGNOSIS — R030 Elevated blood-pressure reading, without diagnosis of hypertension: Secondary | ICD-10-CM | POA: Diagnosis not present

## 2016-10-23 DIAGNOSIS — G479 Sleep disorder, unspecified: Secondary | ICD-10-CM

## 2016-10-23 NOTE — Patient Instructions (Signed)
Insomnia Insomnia is a sleep disorder that makes it difficult to fall asleep or to stay asleep. Insomnia can cause tiredness (fatigue), low energy, difficulty concentrating, mood swings, and poor performance at work or school. There are three different ways to classify insomnia:  Difficulty falling asleep.  Difficulty staying asleep.  Waking up too early in the morning. Any type of insomnia can be long-term (chronic) or short-term (acute). Both are common. Short-term insomnia usually lasts for three months or less. Chronic insomnia occurs at least three times a week for longer than three months. What are the causes? Insomnia may be caused by another condition, situation, or substance, such as:  Anxiety.  Certain medicines.  Gastroesophageal reflux disease (GERD) or other gastrointestinal conditions.  Asthma or other breathing conditions.  Restless legs syndrome, sleep apnea, or other sleep disorders.  Chronic pain.  Menopause. This may include hot flashes.  Stroke.  Abuse of alcohol, tobacco, or illegal drugs.  Depression.  Caffeine.  Neurological disorders, such as Alzheimer disease.  An overactive thyroid (hyperthyroidism). The cause of insomnia may not be known. What increases the risk? Risk factors for insomnia include:  Gender. Women are more commonly affected than men.  Age. Insomnia is more common as you get older.  Stress. This may involve your professional or personal life.  Income. Insomnia is more common in people with lower income.  Lack of exercise.  Irregular work schedule or night shifts.  Traveling between different time zones. What are the signs or symptoms? If you have insomnia, trouble falling asleep or trouble staying asleep is the main symptom. This may lead to other symptoms, such as:  Feeling fatigued.  Feeling nervous about going to sleep.  Not feeling rested in the morning.  Having trouble concentrating.  Feeling irritable,  anxious, or depressed. How is this treated? Treatment for insomnia depends on the cause. If your insomnia is caused by an underlying condition, treatment will focus on addressing the condition. Treatment may also include:  Medicines to help you sleep.  Counseling or therapy.  Lifestyle adjustments. Follow these instructions at home:  Take medicines only as directed by your health care provider.  Keep regular sleeping and waking hours. Avoid naps.  Keep a sleep diary to help you and your health care provider figure out what could be causing your insomnia. Include:  When you sleep.  When you wake up during the night.  How well you sleep.  How rested you feel the next day.  Any side effects of medicines you are taking.  What you eat and drink.  Make your bedroom a comfortable place where it is easy to fall asleep:  Put up shades or special blackout curtains to block light from outside.  Use a white noise machine to block noise.  Keep the temperature cool.  Exercise regularly as directed by your health care provider. Avoid exercising right before bedtime.  Use relaxation techniques to manage stress. Ask your health care provider to suggest some techniques that may work well for you. These may include:  Breathing exercises.  Routines to release muscle tension.  Visualizing peaceful scenes.  Cut back on alcohol, caffeinated beverages, and cigarettes, especially close to bedtime. These can disrupt your sleep.  Do not overeat or eat spicy foods right before bedtime. This can lead to digestive discomfort that can make it hard for you to sleep.  Limit screen use before bedtime. This includes:  Watching TV.  Using your smartphone, tablet, and computer.  Stick to a  routine. This can help you fall asleep faster. Try to do a quiet activity, brush your teeth, and go to bed at the same time each night.  Get out of bed if you are still awake after 15 minutes of trying to  sleep. Keep the lights down, but try reading or doing a quiet activity. When you feel sleepy, go back to bed.  Make sure that you drive carefully. Avoid driving if you feel very sleepy.  Keep all follow-up appointments as directed by your health care provider. This is important. Contact a health care provider if:  You are tired throughout the day or have trouble in your daily routine due to sleepiness.  You continue to have sleep problems or your sleep problems get worse. Get help right away if:  You have serious thoughts about hurting yourself or someone else. This information is not intended to replace advice given to you by your health care provider. Make sure you discuss any questions you have with your health care provider. Document Released: 08/22/2000 Document Revised: 01/25/2016 Document Reviewed: 05/26/2014 Elsevier Interactive Patient Education  2017 [employer].

## 2016-10-23 NOTE — Progress Notes (Signed)
   Subjective:    Patient ID: Mark Macias, male    DOB: September 14, 1970, 46 y.o.   MRN: [id number]  HPI He was seen yesterday at [facility] and a blood pressure there was 136/86. He came here for consult concerning that. The initial blood pressure here was again slightly elevated. After repeat blood pressure with him in a sitting resting position, his numbers were okay. His medications were reviewed. He is on a sleep medication.  Review of Systems     Objective:   Physical Exam alert and in no distress otherwise not examined      Assessment & Plan:  Elevated blood pressure reading The follow-up blood pressure reading. Rested for several minutes was in the normal range. I then spent the rest of time discussing diagnosis and treatment of blood pressure and possible symptoms from elevated blood pressure. I then discussed sleep and sleep hygiene with him. Information was given.

## 2016-11-11 DIAGNOSIS — M47816 Spondylosis without myelopathy or radiculopathy, lumbar region: Secondary | ICD-10-CM | POA: Diagnosis not present

## 2016-11-27 ENCOUNTER — Ambulatory Visit (INDEPENDENT_AMBULATORY_CARE_PROVIDER_SITE_OTHER): Payer: BLUE CROSS/BLUE SHIELD | Admitting: Family Medicine

## 2016-11-27 ENCOUNTER — Encounter: Payer: Self-pay | Admitting: Family Medicine

## 2016-11-27 VITALS — BP 120/86 | HR 68 | Temp 98.9°F | Ht 70.0 in | Wt 174.4 lb

## 2016-11-27 DIAGNOSIS — R05 Cough: Secondary | ICD-10-CM

## 2016-11-27 DIAGNOSIS — J302 Other seasonal allergic rhinitis: Secondary | ICD-10-CM

## 2016-11-27 DIAGNOSIS — R059 Cough, unspecified: Secondary | ICD-10-CM

## 2016-11-27 MED ORDER — BENZONATATE 200 MG PO CAPS: 200.0000 mg | ORAL_CAPSULE | Freq: Three times a day (TID) | ORAL | 0 refills | Status: DC | PRN

## 2016-11-27 NOTE — Patient Instructions (Signed)
Drink plenty of fluids Continue Dayquil during the day, Nyquil at night.  Add Mucinex 12 hour (plain) in addition. Use the benzonatate as needed for cough.   Contact us if your mucus becomes discolored, increased sinus pain, fever, or other new symptoms.  Return for re-evaluation if any new symptoms, shortness of breath, chest pain, other concerns.

## 2016-11-27 NOTE — Progress Notes (Signed)
Chief Complaint  Patient presents with  . Cough    started about a week ago. Mucus is clear, no fevers. has tried dayquil, nyquil, zyrtec and flonase.    He reports that his in-laws were both sick about a month ago.  As the weather then started to change, he started getting sick. He typically has problems with season changes.  Last week he started getting worse--increase in head congestion.  Nasal mucus is clear.  Nonproductive cough.  Denies chest congestion or shortness of breath.  Denies itchy/watery eyes. His ears are stopped up, not painful.  No sinus pain. Cough is keeping him awake at night, despite using Nyquil, and bothersome during the day.  He has previously taken hycodan and tessalon [employer]) with good results.  No fevers, chills, body aches. He quit smoking in January.  He has been taking Nyquil at night, Dayquil during the day, along with nasal saline, Flonase, Zyrtec.  PMH, PSH, SH reviewed  Outpatient Encounter Prescriptions as of 11/27/2016  Medication Sig Note  . buPROPion (WELLBUTRIN SR) 100 MG 12 hr tablet Take 100 mg by mouth every morning.   . cetirizine (ZYRTEC) 10 MG tablet Take 10 mg by mouth daily. 03/18/2016: prn  . FLUoxetine (PROZAC) 20 MG tablet Take 60 mg by mouth daily.   . fluticasone (FLONASE) 50 MCG/ACT nasal spray USE 1 SPRAY BY NASAL ROUTE DAILY FOR 30 DAYS.   Marland Kitchen zolpidem (AMBIEN) 5 MG tablet TAKE 1 TABLET BY MOUTH AT BEDTIME AS NEEDED AS NEEDED FOR SLEEP   . BACLOFEN PO Take 50 mg by mouth at bedtime.   . [DISCONTINUED] FLUoxetine (PROZAC) 40 MG capsule Take 40 mg by mouth every morning.   . [DISCONTINUED] hydrOXYzine (ATARAX/VISTARIL) 25 MG tablet TAKE 1 TABLET BY MOUTH TWICE A DAY AS NEEDED FOR ACUTE ANXIETY   . [DISCONTINUED] hydrOXYzine (VISTARIL) 50 MG capsule Take 50 mg by mouth at bedtime.    No facility-administered encounter medications on file as of 11/27/2016.    ROS: no fever, chills, headaches, chest pain, shortness of breath,  nausea, vomiting, diarrhea, bleeding, bruising, rash, swelling or other concerns.  PHYSICAL EXAM:  BP 120/86 (BP Location: Left Arm, Patient Position: Sitting, Cuff Size: Normal)   Pulse 68   Temp 98.9 F (37.2 C) (Tympanic)   Ht 5' 10"  (1.778 m)   Wt 174 lb 6.4 oz (79.1 kg)   BMI 25.02 kg/m    Well developed, pleasant male in no distress.  Rare dry cough.  Sounds congested/nasal HEENT: PERRL, EOMI, conjunctiva and sclera are clear. TM's scarred bilaterally, otherwise normal.  Normal EAC's.  Nasal mucosa is mildly edematous, L>R, clear-white mucus noted on the left.  Sinuses nontender.  OP is clear Neck: no lymphadenopathy or mass Heart: regular rate and rhythm no murmur Lungs: clear bilaterally Skin: normal turgor, no rash Extremities: no edema Neuro: alert and oriented, cranial nerves intact, normal gait, strength  ASSESSMENT/PLAN:  Acute seasonal allergic rhinitis, unspecified trigger  Cough - Plan: benzonatate (TESSALON) 200 MG capsule  No evidence of bacterial infection.  Supportive measures reviewed, along with s/sx infection.   Drink plenty of fluids Continue Dayquil during the day, Nyquil at night.  Add Mucinex 12 hour (plain) in addition. Use the benzonatate as needed for cough.   Contact us if your mucus becomes discolored, increased sinus pain, fever, or other new symptoms.  Return for re-evaluation if any new symptoms, shortness of breath, chest pain, other concerns.

## 2016-11-28 ENCOUNTER — Ambulatory Visit: Payer: BLUE CROSS/BLUE SHIELD | Admitting: Medical

## 2016-12-22 DIAGNOSIS — M47816 Spondylosis without myelopathy or radiculopathy, lumbar region: Secondary | ICD-10-CM | POA: Diagnosis not present

## 2017-01-19 ENCOUNTER — Encounter: Payer: Self-pay | Admitting: Family Medicine

## 2017-01-19 ENCOUNTER — Ambulatory Visit (INDEPENDENT_AMBULATORY_CARE_PROVIDER_SITE_OTHER): Payer: BLUE CROSS/BLUE SHIELD | Admitting: Family Medicine

## 2017-01-19 ENCOUNTER — Ambulatory Visit
Admission: RE | Admit: 2017-01-19 | Discharge: 2017-01-19 | Disposition: A | Discharge status: Home / Self Care | Payer: BLUE CROSS/BLUE SHIELD | Source: Ambulatory Visit | Attending: Family Medicine | Admitting: Family Medicine

## 2017-01-19 VITALS — BP 110/72 | HR 70 | Wt 173.0 lb

## 2017-01-19 DIAGNOSIS — R109 Unspecified abdominal pain: Secondary | ICD-10-CM

## 2017-01-19 DIAGNOSIS — M545 Low back pain, unspecified: Secondary | ICD-10-CM | POA: Insufficient documentation

## 2017-01-19 DIAGNOSIS — K429 Umbilical hernia without obstruction or gangrene: Secondary | ICD-10-CM | POA: Diagnosis not present

## 2017-01-19 DIAGNOSIS — G8929 Other chronic pain: Secondary | ICD-10-CM | POA: Diagnosis not present

## 2017-01-19 LAB — POCT URINALYSIS DIPSTICK
Bilirubin, UA: NEGATIVE
Blood, UA: NEGATIVE
Glucose, UA: NEGATIVE
Ketones, UA: NEGATIVE
Leukocytes, UA: NEGATIVE
Nitrite, UA: NEGATIVE
Protein, UA: POSITIVE
Spec Grav, UA: >=1.03 — AB (ref 1.010–1.025)
Urobilinogen, UA: NEGATIVE E.U./dL — AB
pH, UA: 6 (ref 5.0–8.0)

## 2017-01-19 MED ORDER — KETOROLAC TROMETHAMINE 60 MG/2ML IM SOLN
60.0000 mg | Freq: Once | INTRAMUSCULAR | Status: AC
  Administered 2017-01-19: 60 mg via INTRAMUSCULAR

## 2017-01-19 MED ORDER — HYDROCODONE-ACETAMINOPHEN 5-325 MG PO TABS: 1.0000 | ORAL_TABLET | Freq: Four times a day (QID) | ORAL | 0 refills | Status: DC | PRN

## 2017-01-19 NOTE — Progress Notes (Signed)
   Subjective:    Patient ID: Mark Macias, male    DOB: 02/22/1971, 46 y.o.   MRN: [id number]  HPI He complains of a one-day history of right CVA pain. This morning the pain was so intense that he vomited. He has never had pain quite like this. He does have a previous history of chronic low back pain because of a work-related injury and is being cared for by Dr.Cohen he has been getting injections. He states that this pain is much worse than the low back pain. The pain is not changed with motion.  Review of Systems     Objective:   Physical Exam Alert and in moderate distress. No CVA tenderness. His urine dipstick and microscopic was negative however his symptoms are very consistent with stone.       Assessment & Plan:  Right flank pain - Plan: CT Abdomen Pelvis Wo Contrast, HYDROcodone-acetaminophen (NORCO) 5-325 MG tablet, ketorolac (TORADOL) injection 60 mg  Chronic low back pain, unspecified back pain laterality, with sciatica presence unspecified - Plan: POCT Urinalysis Dipstick Scan was negative. Will treat conservatively but IF continued difficulty, return here.

## 2017-01-26 ENCOUNTER — Encounter: Payer: Self-pay | Admitting: Family Medicine

## 2017-01-26 ENCOUNTER — Telehealth: Payer: Self-pay | Admitting: Family Medicine

## 2017-01-26 ENCOUNTER — Ambulatory Visit (INDEPENDENT_AMBULATORY_CARE_PROVIDER_SITE_OTHER): Payer: BLUE CROSS/BLUE SHIELD | Admitting: Family Medicine

## 2017-01-26 VITALS — BP 122/84 | HR 66 | Wt 171.0 lb

## 2017-01-26 DIAGNOSIS — M461 Sacroiliitis, not elsewhere classified: Secondary | ICD-10-CM | POA: Diagnosis not present

## 2017-01-26 NOTE — Patient Instructions (Signed)
4 ibuprofen 3 times per day. Codeine as needed for pain mainly at night. Heat for 20 minutes 3 times per day and then gentle stretching after that. On the sagittal physical therapy is

## 2017-01-26 NOTE — Telephone Encounter (Signed)
Wife called & states pt very sick still in pain & vomiting.  Per JCL have pt come on in & we will work him in

## 2017-01-26 NOTE — Progress Notes (Signed)
   Subjective:    Patient ID: Mark Macias, male    DOB: 06-20-1971, 46 y.o.   MRN: [id number]  HPI He is here for a recheck on his back pain. He continues to complain of right-sided low back pain. He now states that the pain is made worse with any motion. He states that it does radiate down to the posterior aspect of his leg but no numbness, tingling or weakness. He does have a previous history of low back pain and is scheduled for RFA. Codeine does help but he is not interested in necessarily getting more of that.   Review of Systems     Objective:   Physical Exam Alert and in no distress. Tender to palpation over the right SI joint. Fabere testing positive. Straight leg raising slightly positive at 60 but negative sciatic stretch. DTRs normal. Normal hip motion.       Assessment & Plan:  Sacroiliitis ([place]) - Plan: Ambulatory referral to Physical Therapy His symptoms are much more consistent with sacroiliitis. Recommended heat, stretching, 800 3 times a day of ibuprofen and will refer to physical therapy. His pain is really much more than I would expect with just SI joint dysfunction so we'll follow-up if no improvement. May possibly refer back to orthopedics

## 2017-02-10 ENCOUNTER — Telehealth: Payer: Self-pay | Admitting: Family Medicine

## 2017-02-10 DIAGNOSIS — R109 Unspecified abdominal pain: Secondary | ICD-10-CM

## 2017-02-10 MED ORDER — HYDROCODONE-ACETAMINOPHEN 5-325 MG PO TABS: 1.0000 | ORAL_TABLET | Freq: Four times a day (QID) | ORAL | 0 refills | Status: DC | PRN

## 2017-02-10 NOTE — Telephone Encounter (Signed)
Pt requesting refill Hydrocodone 5-325 mg

## 2017-02-16 ENCOUNTER — Ambulatory Visit: Payer: BLUE CROSS/BLUE SHIELD | Attending: Family Medicine

## 2017-03-03 DIAGNOSIS — F4310 Post-traumatic stress disorder, unspecified: Secondary | ICD-10-CM | POA: Diagnosis not present

## 2017-03-03 DIAGNOSIS — F411 Generalized anxiety disorder: Secondary | ICD-10-CM | POA: Diagnosis not present

## 2017-03-03 DIAGNOSIS — F329 Major depressive disorder, single episode, unspecified: Secondary | ICD-10-CM | POA: Diagnosis not present

## 2017-03-05 ENCOUNTER — Telehealth: Payer: Self-pay

## 2017-03-05 NOTE — Telephone Encounter (Signed)
Records rcvd from [facility] placed in your folder for review. Victorino December

## 2017-03-14 DIAGNOSIS — F4310 Post-traumatic stress disorder, unspecified: Secondary | ICD-10-CM | POA: Diagnosis not present

## 2017-03-14 DIAGNOSIS — F319 Bipolar disorder, unspecified: Secondary | ICD-10-CM | POA: Diagnosis not present

## 2017-04-06 DIAGNOSIS — F4310 Post-traumatic stress disorder, unspecified: Secondary | ICD-10-CM | POA: Diagnosis not present

## 2017-04-06 DIAGNOSIS — F319 Bipolar disorder, unspecified: Secondary | ICD-10-CM | POA: Diagnosis not present

## 2017-04-08 DIAGNOSIS — M47816 Spondylosis without myelopathy or radiculopathy, lumbar region: Secondary | ICD-10-CM | POA: Diagnosis not present

## 2017-04-08 DIAGNOSIS — M549 Dorsalgia, unspecified: Secondary | ICD-10-CM | POA: Diagnosis not present

## 2017-04-08 DIAGNOSIS — M47817 Spondylosis without myelopathy or radiculopathy, lumbosacral region: Secondary | ICD-10-CM | POA: Diagnosis not present

## 2017-04-08 DIAGNOSIS — M5136 Other intervertebral disc degeneration, lumbar region: Secondary | ICD-10-CM | POA: Diagnosis not present

## 2017-04-11 DIAGNOSIS — F319 Bipolar disorder, unspecified: Secondary | ICD-10-CM | POA: Diagnosis not present

## 2017-04-11 DIAGNOSIS — F4310 Post-traumatic stress disorder, unspecified: Secondary | ICD-10-CM | POA: Diagnosis not present

## 2017-04-20 DIAGNOSIS — F4310 Post-traumatic stress disorder, unspecified: Secondary | ICD-10-CM | POA: Diagnosis not present

## 2017-04-20 DIAGNOSIS — F319 Bipolar disorder, unspecified: Secondary | ICD-10-CM | POA: Diagnosis not present

## 2017-05-04 DIAGNOSIS — F4310 Post-traumatic stress disorder, unspecified: Secondary | ICD-10-CM | POA: Diagnosis not present

## 2017-05-04 DIAGNOSIS — F319 Bipolar disorder, unspecified: Secondary | ICD-10-CM | POA: Diagnosis not present

## 2017-05-16 DIAGNOSIS — F319 Bipolar disorder, unspecified: Secondary | ICD-10-CM | POA: Diagnosis not present

## 2017-05-16 DIAGNOSIS — F4310 Post-traumatic stress disorder, unspecified: Secondary | ICD-10-CM | POA: Diagnosis not present

## 2017-06-13 DIAGNOSIS — F319 Bipolar disorder, unspecified: Secondary | ICD-10-CM | POA: Diagnosis not present

## 2017-06-13 DIAGNOSIS — F39 Unspecified mood [affective] disorder: Secondary | ICD-10-CM | POA: Diagnosis not present

## 2017-06-13 DIAGNOSIS — F4310 Post-traumatic stress disorder, unspecified: Secondary | ICD-10-CM | POA: Diagnosis not present

## 2017-07-16 ENCOUNTER — Encounter: Payer: Self-pay | Admitting: Family Medicine

## 2017-07-16 ENCOUNTER — Ambulatory Visit: Payer: BLUE CROSS/BLUE SHIELD | Admitting: Family Medicine

## 2017-07-16 VITALS — BP 124/80 | HR 64 | Temp 98.1°F | Wt 183.2 lb

## 2017-07-16 DIAGNOSIS — G510 Bell's palsy: Secondary | ICD-10-CM | POA: Diagnosis not present

## 2017-07-16 NOTE — Patient Instructions (Addendum)
If your symptoms get any worse call or come back in but you seem to have a mild case of this.  If your eye feels dry or you have any difficulty closing your left eye lid then make sure you use eye drops to keep it moist and you may even need to use scotch tape at night to close it you cannot close it on your own.   Your left arm symptoms do not appear to be related. If this continues or gets worse then return and have it further evaluated.    Bell Palsy, Adult Bell palsy is a short-term inability to move muscles in part of the face. The inability to move (paralysis) results from inflammation or compression of the facial nerve, which travels along the skull and under the ear to the side of the face (7th cranial nerve). This nerve is responsible for facial movements that include blinking, closing the eyes, smiling, and frowning. What are the causes? The exact cause of this condition is not known. It may be caused by an infection from a virus, such as the chickenpox (herpes zoster), Epstein-Barr, or mumps virus. What increases the risk? You are more likely to develop this condition if:  You are pregnant.  You have diabetes.  You have had a recent infection in your nose, throat, or airways (upper respiratory infection).  You have a weakened body defense system (immune system).  You have had a facial injury, such as a fracture.  You have a family history of Bell palsy.  What are the signs or symptoms? Symptoms of this condition include:  Weakness on one side of the face.  Drooping eyelid and corner of the mouth.  Excessive tearing in one eye.  Difficulty closing the eyelid.  Dry eye.  Drooling.  Dry mouth.  Changes in taste.  Change in facial appearance.  Pain behind one ear.  Ringing in one or both ears.  Sensitivity to sound in one ear.  Facial twitching.  Headache.  Impaired speech.  Dizziness.  Difficulty eating or drinking.  Most of the time, only one  side of the face is affected. Rarely, Bell palsy affects the whole face. How is this diagnosed? This condition is diagnosed based on:  Your symptoms.  Your medical history.  A physical exam.  You may also have to see health care providers who specialize in disorders of the nerves (neurologist) or diseases and conditions of the eye (ophthalmologist). You may have tests, such as:  A test to check for nerve damage (electromyogram).  Imaging studies, such as CT or MRI scans.  Blood tests.  How is this treated? This condition affects every person differently. Sometimes symptoms go away without treatment within a couple weeks. If treatment is needed, it varies from person to person. The goal of treatment is to reduce inflammation and protect the eye from damage. Treatment for Bell palsy may include:  Medicines, such as: ? Steroids to reduce swelling and inflammation. ? Antiviral drugs. ? Pain relievers, including aspirin, acetaminophen, or ibuprofen.  Eye drops or ointment to keep your eye moist.  Eye protection, if you cannot close your eye.  Exercises or massage to regain muscle strength and function (physical therapy).  Follow these instructions at home:  Take over-the-counter and prescription medicines only as told by your health care provider.  If your eye is affected: ? Keep your eye moist with eye drops or ointment as told by your health care provider. ? Follow instructions for eye care  and protection as told by your health care provider.  Do any physical therapy exercises as told by your health care provider.  Keep all follow-up visits as told by your health care provider. This is important. Contact a health care provider if:  You have a fever.  Your symptoms do not get better within 2-3 weeks, or your symptoms get worse.  Your eye is red, irritated, or painful.  You have new symptoms. Get help right away if:  You have weakness or numbness in a part of your  body other than your face.  You have trouble swallowing.  You develop neck pain or stiffness.  You develop dizziness or shortness of breath. Summary  Bell palsy is a short-term inability to move muscles in part of the face. The inability to move (paralysis) results from inflammation or compression of the facial nerve.  This condition affects every person differently. Sometimes symptoms go away without treatment within a couple weeks.  If treatment is needed, it varies from person to person. The goal of treatment is to reduce inflammation and protect the eye from damage.  Contact your health care provider if your symptoms do not get better within 2-3 weeks, or your symptoms get worse. This information is not intended to replace advice given to you by your health care provider. Make sure you discuss any questions you have with your health care provider. Document Released: 08/25/2005 Document Revised: 10/28/2016 Document Reviewed: 10/28/2016 Elsevier Interactive Patient Education  [employer].

## 2017-07-16 NOTE — Progress Notes (Signed)
Subjective:    Patient ID: Mark Macias, male    DOB: 1971-07-14, 46 y.o.   MRN: [id number]  HPI Chief Complaint  Patient presents with  . numbness on left side    numbness on lef side of face for the last 2-3 days. tingling sensation   He is here with complaints of a 4 day history of left sided facial tingling, numbness, and left eye draining clear fluid. States the left side of side of his tongue and lip feel numb. No difficulty swallowing or breathing. No rash. States his symptoms were preceded by a common cold last week.  Denies fever, chills, headache, dizziness, confusion, vision changes,speech changes, ear pain, tinnitus, sinus pressure, chest pain, palpitations, shortness of breath, cough, abdominal pain, N/V/D, urinary symptoms.   States he is also having numbness, tingling in his left arm but this has been intermittent and ongoing for several weeks. States symptoms are worse at night and often wakes up with his left arm feeling numb. States he was advised to wear a strap at some point in the past but he has not been doing this.    No other concerns.   Reviewed allergies, medications, past medical, surgical, and social history.    Review of Systems Pertinent positives and negatives in the history of present illness.     Objective:   Physical Exam  Constitutional: He is oriented to person, place, and time. He appears well-developed and well-nourished. No distress.  HENT:  Right Ear: Tympanic membrane and ear canal normal.  Left Ear: Tympanic membrane and ear canal normal.  Nose: Nose normal.  Mouth/Throat: Uvula is midline, oropharynx is clear and moist and mucous membranes are normal.  Loss of furrow over left forehead, mild left sided mouth droop.  No rash.   Eyes: Conjunctivae, EOM and lids are normal. Pupils are equal, round, and reactive to light.  No tearing noted to left eye, slightly delayed closure but complete closure of left eyelid is present.    Neck: Full  passive range of motion without pain. Neck supple.  Cardiovascular: Normal rate, regular rhythm, normal heart sounds and normal pulses.  Pulmonary/Chest: Effort normal and breath sounds normal.  Musculoskeletal:       Left shoulder: Normal.       Cervical back: Normal.  Lymphadenopathy:    He has no cervical adenopathy.  No head or neck adenopathy   Neurological: He is alert and oriented to person, place, and time. He has normal strength and normal reflexes. No cranial nerve deficit or sensory deficit. He displays a negative Romberg sign. Coordination and gait normal.  Present but decreased sensation of left facial nerve subjectively Normal symmetry and tone of face at rest.   Normal finger to nose, heel to shin.     Skin: Skin is warm and dry. No rash noted. No pallor.  Psychiatric: He has a normal mood and affect. His speech is normal and behavior is normal. Thought content normal.   BP 124/80   Pulse 64   Temp 98.1 F (36.7 C) (Oral)   Wt 183 lb 3.2 oz (83.1 kg)   SpO2 98%   BMI 26.29 kg/m       Assessment & Plan:  Left-sided Bell's palsy  His neurological exam is unremarkable.  Discussed usual course of Bell's palsy and management. He appears to be having a mild case of this and is 4 days into the illness. Discussed options of treating him with steroids or antivirals and  believe at this point the risks would outweigh the benefits. He will monitor his symptoms, use eye protection, and call or return if his symptoms worsen or any new symptoms arise.  Discussed that his LUE intermittent symptoms are ongoing and not related to this illness. He will follow up with Dr. Redmond School for this if needed since he has treated him in the past for it.  Dr Redmond School also examined patient and agrees with plan.

## 2017-07-25 DIAGNOSIS — F39 Unspecified mood [affective] disorder: Secondary | ICD-10-CM | POA: Diagnosis not present

## 2017-07-25 DIAGNOSIS — F319 Bipolar disorder, unspecified: Secondary | ICD-10-CM | POA: Diagnosis not present

## 2017-07-25 DIAGNOSIS — F4310 Post-traumatic stress disorder, unspecified: Secondary | ICD-10-CM | POA: Diagnosis not present

## 2017-09-12 DIAGNOSIS — F4310 Post-traumatic stress disorder, unspecified: Secondary | ICD-10-CM | POA: Diagnosis not present

## 2017-09-12 DIAGNOSIS — F319 Bipolar disorder, unspecified: Secondary | ICD-10-CM | POA: Diagnosis not present

## 2017-09-12 DIAGNOSIS — F39 Unspecified mood [affective] disorder: Secondary | ICD-10-CM | POA: Diagnosis not present

## 2017-10-07 ENCOUNTER — Ambulatory Visit: Payer: BLUE CROSS/BLUE SHIELD | Admitting: Medical

## 2017-10-07 VITALS — BP 126/80 | HR 71 | Temp 98.0°F | Wt 184.8 lb

## 2017-10-07 DIAGNOSIS — R059 Cough, unspecified: Secondary | ICD-10-CM

## 2017-10-07 DIAGNOSIS — J988 Other specified respiratory disorders: Secondary | ICD-10-CM

## 2017-10-07 DIAGNOSIS — Z87891 Personal history of nicotine dependence: Secondary | ICD-10-CM | POA: Diagnosis not present

## 2017-10-07 DIAGNOSIS — R05 Cough: Secondary | ICD-10-CM | POA: Diagnosis not present

## 2017-10-07 MED ORDER — HYDROCODONE-HOMATROPINE 5-1.5 MG/5ML PO SYRP
5.0000 mL | ORAL_SOLUTION | Freq: Three times a day (TID) | ORAL | 0 refills | Status: DC | PRN
Start: 2017-10-07 — End: 2021-11-08

## 2017-10-07 NOTE — Progress Notes (Signed)
  Subjective:  Mark Macias is a 47 y.o. male who presents for respiratory illness.  He reports starting to feel bad about 5 days ago.  He reports runny nose, cough, cough keeping him up at night not getting sleep, some dyspnea, ear pressure.  Denies fever, wheezing.  He reports mild body aches and chills.  He is using NyQuil the last 2 nights but is not helping much.  Denies sick contacts. No other aggravating or relieving factors. No other complaint.  He does note that he and his wife quit smoking 6 months ago.  No other aggravating or relieving factors.  No other c/o.  Past Medical History:  Diagnosis Date  . Allergy    RHINITIS  . Allergy to bee sting   . Left-sided Bell's palsy   . Mood disorder ([place])   . Periumbilical hernia   . Smoker    Current Outpatient Medications on File Prior to Visit  Medication Sig Dispense Refill  . FLUoxetine (PROZAC) 20 MG tablet Take 60 mg by mouth daily.    . fluticasone (FLONASE) 50 MCG/ACT nasal spray USE 1 SPRAY BY NASAL ROUTE DAILY FOR 30 DAYS.  11  . hydrOXYzine (VISTARIL) 25 MG capsule Take 25 mg by mouth 3 (three) times daily as needed. Pt takes 2 pills at night    . cetirizine (ZYRTEC) 10 MG tablet Take 10 mg by mouth daily.     No current facility-administered medications on file prior to visit.     ROS as in subjective   Objective: BP 126/80   Pulse 71   Temp 98 F (36.7 C)   Wt 184 lb 12.8 oz (83.8 kg)   SpO2 97%   BMI 26.52 kg/m   General appearance: alert, no distress, WD/WN HEENT: normocephalic, sclerae anicteric, TMs pearly, nares with mild turbinate edema, mild clear discharge,+erythema, pharynx with mild erythema Oral cavity: MMM, no lesions Neck: supple, shoddy anterior tender nodes, no thyromegaly, no masses Lungs: CTA bilaterally, no wheezes, rhonchi, or rales       Assessment  Encounter Diagnoses  Name Primary?  Marland Kitchen Respiratory tract infection Yes  . Cough   . Former smoker       Plan: We discussed his  symptoms, exam findings.  Etiology appears to be viral syndrome, viral cold.  He can use medication as below at night to help with sleep and cough, use Mucinex DM during the daytime, rest, hydrate well.  If not much improved in the next few days or if worse to let me know.  Congratulated him on his stopping tobacco.  Gave work note for today and tomorrow  Mark Macias was seen today for coughing, congestion.  Diagnoses and all orders for this visit:  Respiratory tract infection  Cough  Former smoker  Other orders -     HYDROcodone-homatropine (HYCODAN) 5-1.5 MG/5ML syrup; Take 5 mLs by mouth every 8 (eight) hours as needed for cough.

## 2017-10-28 ENCOUNTER — Other Ambulatory Visit: Payer: Self-pay | Admitting: Medical

## 2017-10-28 ENCOUNTER — Telehealth: Payer: Self-pay | Admitting: Medical

## 2017-10-28 MED ORDER — AZITHROMYCIN 250 MG PO TABS: ORAL_TABLET | ORAL | 0 refills | Status: DC

## 2017-10-28 NOTE — Telephone Encounter (Signed)
Lets try a round of antibiotic, med sent

## 2017-10-28 NOTE — Telephone Encounter (Signed)
Pt called and stated his symptoms for last visit have returned. He is stopped and and coughing. He states these are the same symptoms he saw you for. He states he did get a little better but now same symptoms. Pt uses [facility] [address] can be reached at [phone].

## 2017-12-05 DIAGNOSIS — F39 Unspecified mood [affective] disorder: Secondary | ICD-10-CM | POA: Diagnosis not present

## 2017-12-05 DIAGNOSIS — F4310 Post-traumatic stress disorder, unspecified: Secondary | ICD-10-CM | POA: Diagnosis not present

## 2017-12-05 DIAGNOSIS — F319 Bipolar disorder, unspecified: Secondary | ICD-10-CM | POA: Diagnosis not present

## 2017-12-11 ENCOUNTER — Encounter: Payer: Self-pay | Admitting: Medical

## 2017-12-11 ENCOUNTER — Ambulatory Visit: Payer: BLUE CROSS/BLUE SHIELD | Admitting: Medical

## 2017-12-11 VITALS — BP 122/70 | HR 60 | Temp 97.8°F | Ht 70.0 in | Wt 180.6 lb

## 2017-12-11 DIAGNOSIS — R109 Unspecified abdominal pain: Secondary | ICD-10-CM | POA: Diagnosis not present

## 2017-12-11 DIAGNOSIS — K219 Gastro-esophageal reflux disease without esophagitis: Secondary | ICD-10-CM

## 2017-12-11 DIAGNOSIS — R112 Nausea with vomiting, unspecified: Secondary | ICD-10-CM | POA: Diagnosis not present

## 2017-12-11 MED ORDER — OMEPRAZOLE 40 MG PO CPDR: 40.0000 mg | DELAYED_RELEASE_CAPSULE | Freq: Every day | ORAL | 3 refills | Status: DC

## 2017-12-11 MED ORDER — ONDANSETRON HCL 4 MG PO TABS: 4.0000 mg | ORAL_TABLET | Freq: Three times a day (TID) | ORAL | 0 refills | Status: DC | PRN

## 2017-12-11 NOTE — Patient Instructions (Signed)
Gastroesophageal Reflux Disease, Adult  Begin Omeprazole 66m daily about 30 minutes before breakfast, but go ahead and take one now when you pick up the medication today  Avoid acid reflux triggers  Keep a diary of your symptoms.   If no improvement within a week or if worse pain after eating, this may could be a sign of gall bladder issue as well   Gastroesophageal reflux disease (GERD) happens when acid from your stomach flows up into the esophagus. When acid comes in contact with the esophagus, the acid causes soreness (inflammation) in the esophagus. Over time, GERD may create small holes (ulcers) in the lining of the esophagus.  CAUSES   Increased body weight. This puts pressure on the stomach, making acid rise from the stomach into the esophagus.   Smoking. This increases acid production in the stomach.   Drinking alcohol. This causes decreased pressure in the lower esophageal sphincter (valve or ring of muscle between the esophagus and stomach), allowing acid from the stomach into the esophagus.   Late evening meals and a full stomach. This increases pressure and acid production in the stomach.   A malformed lower esophageal sphincter.  Sometimes, no cause is found.  SYMPTOMS   Burning pain in the lower part of the mid-chest behind the breastbone and in the mid-stomach area. This may occur twice a week or more often.   Trouble swallowing.   Sore throat.   Dry cough.   Asthma-like symptoms including chest tightness, shortness of breath, or wheezing.   DIAGNOSIS  Your caregiver may be able to diagnose GERD based on your symptoms. In some cases, X-rays and other tests may be done to check for complications or to check the condition of your stomach and esophagus.   HOME CARE INSTRUCTIONS   Change the factors that you can control. Ask your caregiver for guidance concerning weight loss, quitting smoking, and alcohol consumption.   Avoid foods and drinks that make your  symptoms worse, such as:   Caffeine or alcoholic drinks.   Chocolate.   Peppermint or mint flavorings.   Garlic and onions.   Spicy foods.   Citrus fruits, such as oranges, lemons, or limes.   Tomato-based foods such as sauce, chili, salsa, and pizza.   Fried and fatty foods.   Avoid lying down for the 3 hours prior to your bedtime or prior to taking a nap.   Eat small, frequent meals instead of large meals.   Wear loose-fitting clothing. Do not wear anything tight around your waist that causes pressure on your stomach.   Raise the head of your bed 6 to 8 inches with wood blocks to help you sleep. Extra pillows will not help.   Only take over-the-counter or prescription medicines for pain, discomfort, or fever as directed by your caregiver.   Do not take aspirin, ibuprofen, or other nonsteroidal anti-inflammatory drugs (NSAIDs).   SEEK IMMEDIATE MEDICAL CARE IF:   You have pain in your arms, neck, jaw, teeth, or back.   Your pain increases or changes in intensity or duration.   You develop nausea, vomiting, or sweating (diaphoresis).   You develop shortness of breath, or you faint.   Your vomit is green, yellow, black, or looks like coffee grounds or blood.   Your stool is red, bloody, or black.  These symptoms could be signs of other problems, such as heart disease, gastric bleeding, or esophageal bleeding. MAKE SURE YOU:   Understand these instructions.   Will watch  your condition.   Will get help right away if you are not doing well or get worse.  Document Released: 06/04/2005 Document Revised: 05/07/2011 Document Reviewed: 03/14/2011 [facility] Patient Information 2012 [place].    Cholecystitis Cholecystitis is swelling and irritation (inflammation) of the gallbladder. The gallbladder is an organ that is shaped like a pear. It is under the liver on the right side of the body. This condition is often caused by gallstones. You doctor may do tests to  see how your gallbladder works. These tests may include:  Imaging tests, such as: ? An ultrasound. ? MRI.  Tests that check how your liver works.  This condition needs treatment. Follow these instructions at home: Home care will depend on your treatment. In general:  Take over-the-counter and prescription medicines only as told by your doctor.  If you were prescribed an antibiotic medicine, take it as told by your doctor. Do not stop taking the antibiotic even if you start to feel better.  Follow instructions from your doctor about what to eat or drink. When you are allowed to eat, avoid eating or drinking anything that causes your symptoms to start.  Keep all follow-up visits as told by your doctor. This is important.  Contact a doctor if:  You have pain and your medicine does not help.  You have a fever. Get help right away if:  Your pain moves to: ? Another part of your belly (abdomen). ? Your back.  Your symptoms do not go away.  You have new symptoms. This information is not intended to replace advice given to you by your health care provider. Make sure you discuss any questions you have with your health care provider. Document Released: 08/14/2011 Document Revised: 01/31/2016 Document Reviewed: 12/06/2014 Elsevier Interactive Patient Education  2018 [employer].

## 2017-12-11 NOTE — Progress Notes (Signed)
Subjective: Chief Complaint  Patient presents with  . Acute Visit    vomiting, acid reflux issues   Here for worsening acid reflux, and now having some vomiting.   Has had GERD for years, but start vomiting this morning.   Feels some nausea.   Stomach has been feeling bad.  Has hx/o hernia of abdomen.   He does endorse eating spicy foods, not a lot of citrus. Does eat some tomato based foods.  Doesn't take NSAID regularly.  No alcohol.  Currently taking nothing for this.   No other aggravating or relieving factors. No other complaint.  Past Medical History:  Diagnosis Date  . Allergy    RHINITIS  . Allergy to bee sting   . Left-sided Bell's palsy   . Mood disorder ([place])   . Periumbilical hernia   . Smoker    Current Outpatient Medications on File Prior to Visit  Medication Sig Dispense Refill  . cetirizine (ZYRTEC) 10 MG tablet Take 10 mg by mouth daily.    Marland Kitchen FLUoxetine (PROZAC) 20 MG tablet Take 60 mg by mouth daily.    . hydrOXYzine (VISTARIL) 25 MG capsule Take 25 mg by mouth 3 (three) times daily as needed. Pt takes 2 pills at night    . ARIPiprazole (ABILIFY) 5 MG tablet Take 5 mg by mouth at bedtime.  1  . azithromycin (ZITHROMAX) 250 MG tablet 2 tablets day 1, then 1 tablet days 2-4 (Patient not taking: Reported on 12/11/2017) 6 tablet 0  . fluticasone (FLONASE) 50 MCG/ACT nasal spray USE 1 SPRAY BY NASAL ROUTE DAILY FOR 30 DAYS.  11  . HYDROcodone-homatropine (HYCODAN) 5-1.5 MG/5ML syrup Take 5 mLs by mouth every 8 (eight) hours as needed for cough. (Patient not taking: Reported on 12/11/2017) 120 mL 0   No current facility-administered medications on file prior to visit.    ROS as in subjective   Objective: BP 122/70 (BP Location: Right Arm, Patient Position: Sitting, Cuff Size: Normal)   Pulse 60   Temp 97.8 F (36.6 C) (Oral)   Ht 5' 10"  (1.778 m)   Wt 180 lb 9.6 oz (81.9 kg)   SpO2 97%   BMI 25.91 kg/m   General appearance: alert, no distress, WD/WN,  Oral cavity:  MMM, no lesions Neck: supple, no lymphadenopathy, no thyromegaly, no masses Heart: RRR, normal S1, S2, no murmurs Lungs: CTA bilaterally, no wheezes, rhonchi, or rales Abdomen: +bs, soft,mild RUQ tenders, otherwise non tender, non distended, no masses, no hepatomegaly, no splenomegaly Back: nontender Pulses: 2+ symmetric, upper and lower extremities, normal cap refill     Assessment: Encounter Diagnoses  Name Primary?  . Gastroesophageal reflux disease, esophagitis presence not specified Yes  . Nausea and vomiting, intractability of vomiting not specified, unspecified vomiting type   . Abdominal discomfort      Plan: Begin Omeprazole x 2 weeks, Zofran prn.  Take omeprazole, 1 tablet 30-45 minutes before breakfast every morning for acid reflux.   Avoid acid reflux triggers such as acidic foods, citrus, tomatoes-based foods, caffeine, alcohol, big portions, fried foods, greasy foods, and avoid eating or drinking within 2 hours of bedtime.  Recheck in 2-3 weeks, sooner prn.  Mark Macias was seen today for acute visit.  Diagnoses and all orders for this visit:  Gastroesophageal reflux disease, esophagitis presence not specified  Nausea and vomiting, intractability of vomiting not specified, unspecified vomiting type  Abdominal discomfort  Other orders -     omeprazole (PRILOSEC) 40 MG capsule; Take 1  capsule (40 mg total) by mouth daily. -     ondansetron (ZOFRAN) 4 MG tablet; Take 1 tablet (4 mg total) by mouth every 8 (eight) hours as needed for nausea or vomiting.

## 2018-06-10 ENCOUNTER — Other Ambulatory Visit: Payer: Self-pay | Admitting: Medical

## 2018-07-03 DIAGNOSIS — F39 Unspecified mood [affective] disorder: Secondary | ICD-10-CM | POA: Diagnosis not present

## 2018-07-03 DIAGNOSIS — F4310 Post-traumatic stress disorder, unspecified: Secondary | ICD-10-CM | POA: Diagnosis not present

## 2018-07-03 DIAGNOSIS — F319 Bipolar disorder, unspecified: Secondary | ICD-10-CM | POA: Diagnosis not present

## 2018-10-13 ENCOUNTER — Telehealth: Payer: Self-pay

## 2018-10-13 NOTE — Telephone Encounter (Signed)
gat a call from pt asking if you would fill his Oxcarbazepine 600 mg. Pt takes it at [facility]. Dr. Mallie Snooks is in the hospital per pt . Please advise. Mark Macias

## 2018-10-14 MED ORDER — OXCARBAZEPINE 600 MG PO TABS: 600.0000 mg | ORAL_TABLET | Freq: Every day | ORAL | 0 refills | Status: DC

## 2018-10-14 NOTE — Telephone Encounter (Signed)
I will call in Trileptal.  Apparently his psychiatrist is in the hospital and unable to renew this.  He is to check with his psychiatrist as I am only going to give him a month

## 2018-10-14 NOTE — Telephone Encounter (Signed)
I called in a one-month supply to cover till he can discuss this further with his psychiatrist

## 2018-10-14 NOTE — Telephone Encounter (Signed)
Pt was advised [facility]

## 2018-10-19 ENCOUNTER — Encounter: Payer: Self-pay | Admitting: Family Medicine

## 2018-10-19 ENCOUNTER — Ambulatory Visit (INDEPENDENT_AMBULATORY_CARE_PROVIDER_SITE_OTHER): Payer: Self-pay | Admitting: Family Medicine

## 2018-10-19 VITALS — BP 124/86 | HR 53 | Temp 97.8°F | Wt 164.2 lb

## 2018-10-19 DIAGNOSIS — R112 Nausea with vomiting, unspecified: Secondary | ICD-10-CM

## 2018-10-19 DIAGNOSIS — J069 Acute upper respiratory infection, unspecified: Secondary | ICD-10-CM

## 2018-10-19 NOTE — Progress Notes (Signed)
   Subjective:    Patient ID: Mark Macias, male    DOB: 11-08-1970, 48 y.o.   MRN: [id number]  HPI He had an episode this morning of nausea and vomiting but since then has cleared up.  No diarrhea or abdominal pain.  For the last several weeks he has had ear congestion, nasal congestion, PND but no fever, chills, sore throat.  He seems to be holding his own in that regard.   Review of Systems     Objective:   Physical Exam Alert and in no distress. Tympanic membranes and canals are normal. Pharyngeal area is normal. Neck is supple without adenopathy or thyromegaly. Cardiac exam shows a regular sinus rhythm without murmurs or gallops. Lungs are clear to auscultation.  Abdominal exam shows no masses or tenderness.        Assessment & Plan:  Nausea and vomiting, intractability of vomiting not specified, unspecified vomiting type  Upper respiratory tract infection, unspecified type Recommend supportive care.  He was comfortable with that.

## 2019-04-15 IMAGING — CT CT ABD-PELV W/O CM
1 of 2 series · 15 of 32 positions shown, 19 images · non-contrast
Comparison: None.

CLINICAL DATA: 45-year-old male with right flank pain worsening
over the past 2-3 weeks. Intermittent diarrhea. No history cancer.
Initial encounter.

EXAM:
CT ABDOMEN AND PELVIS WITHOUT CONTRAST
TECHNIQUE: Multidetector CT imaging of the abdomen and pelvis was performed
following the standard protocol without IV contrast.

[Series 2: abd/pelvis w/(date) · axial · 0.75mm/px · z∈[+700,+1100]mm · 15 of 88 slices shown, 19 images]
[im 4/88  soft-tissue]
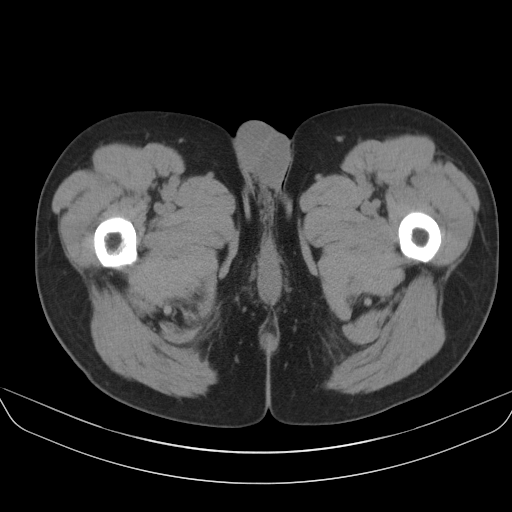
[im 4/88  bone]
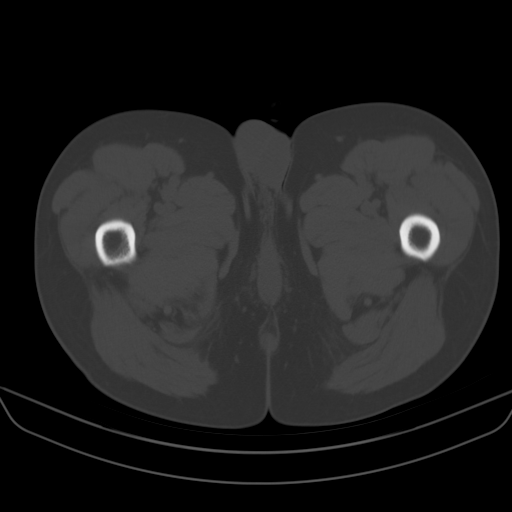
[im 12/88  soft-tissue]
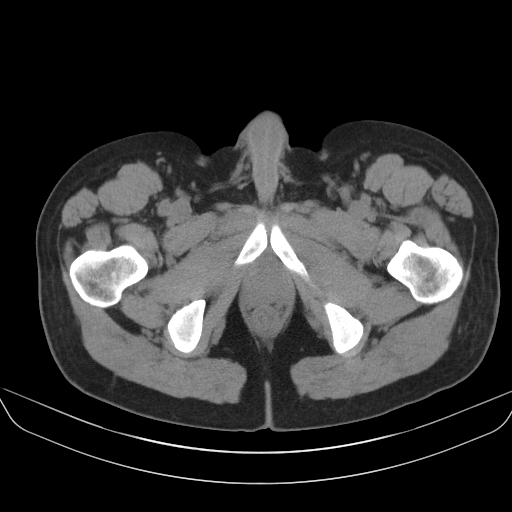
[im 19/88  soft-tissue]
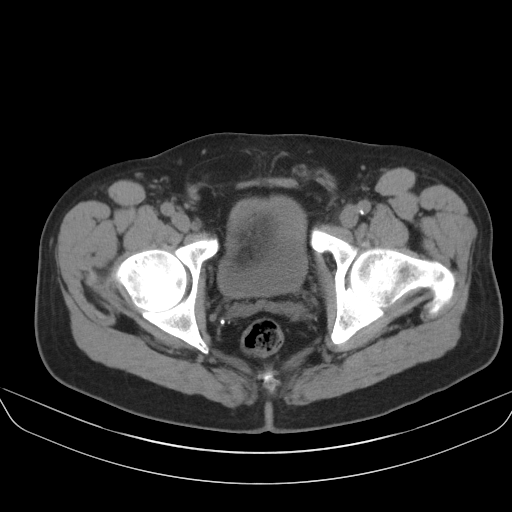
[im 23/88  soft-tissue]
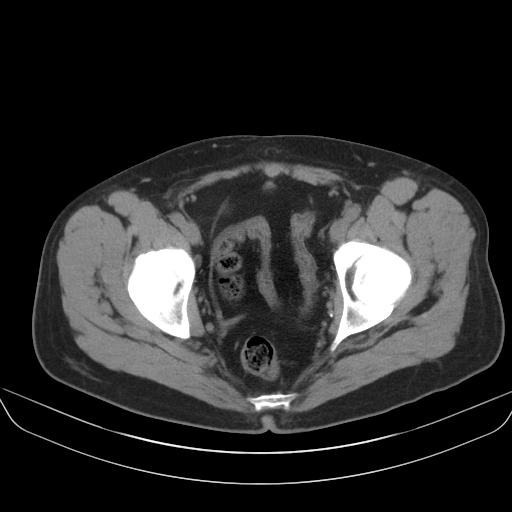
[im 31/88  soft-tissue]
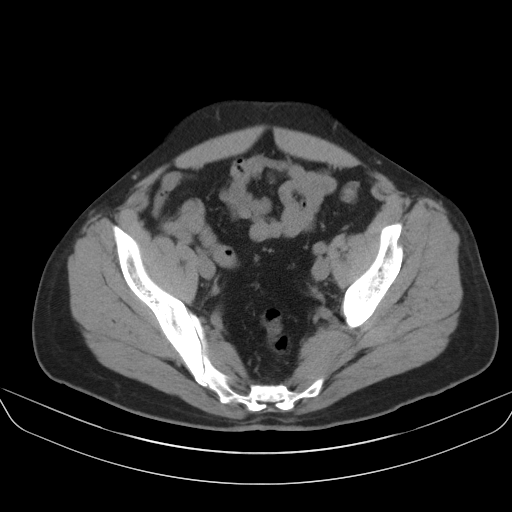
[im 38/88  soft-tissue]
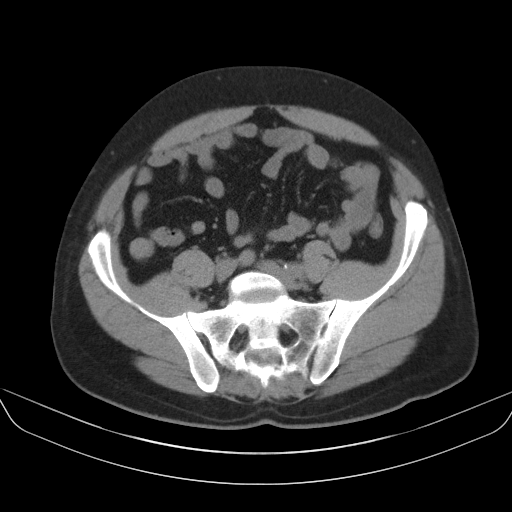
[im 46/88  soft-tissue]
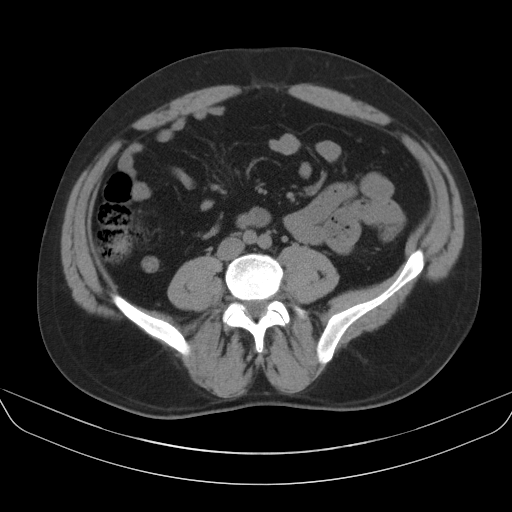
[im 50/88  soft-tissue]
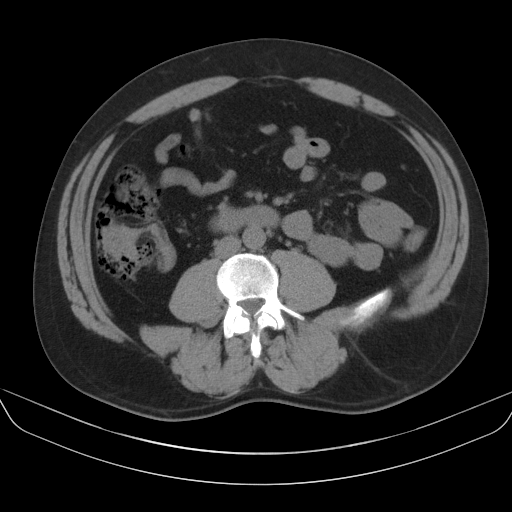
[im 57/88  soft-tissue]
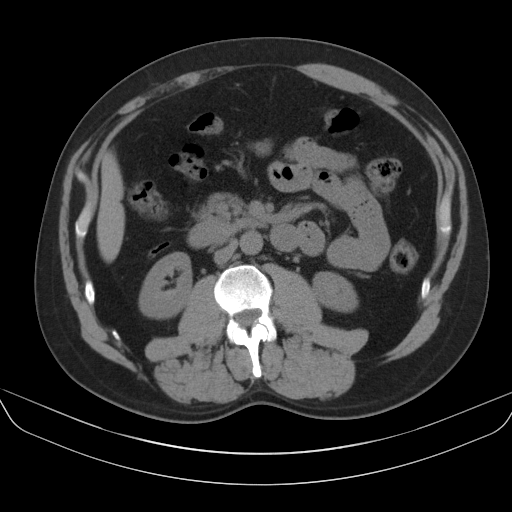
[im 57/88  bone]
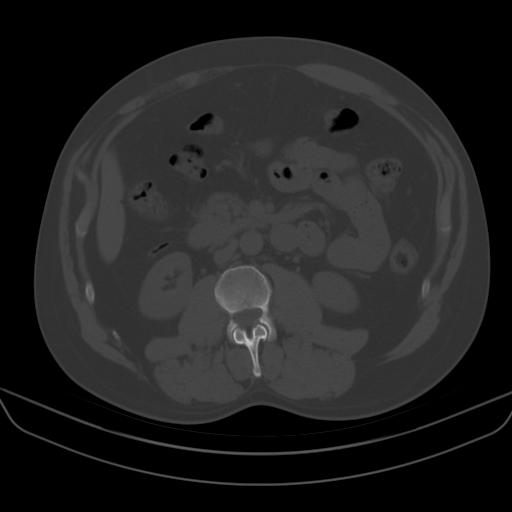
[im 65/88  soft-tissue]
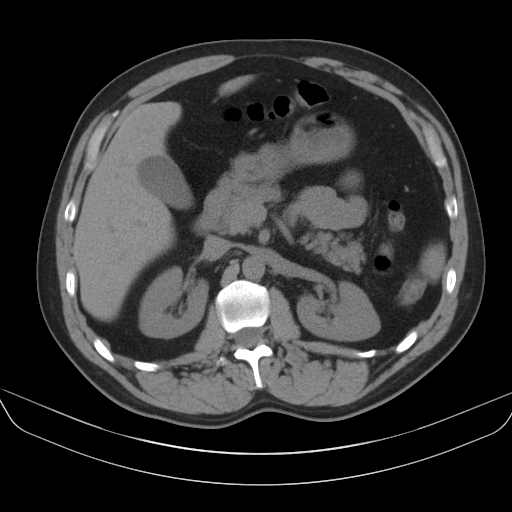
[im 69/88  soft-tissue]
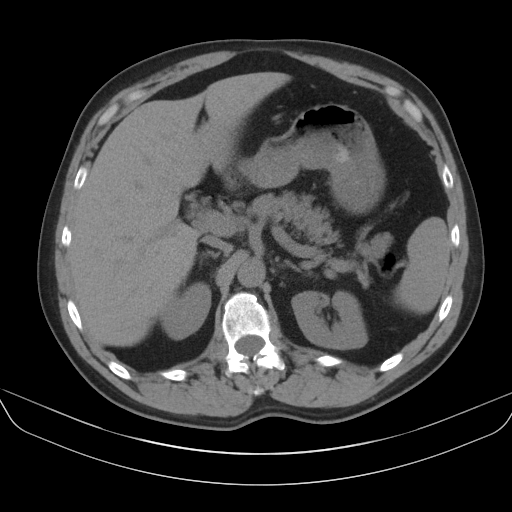
[im 72/88  lung]
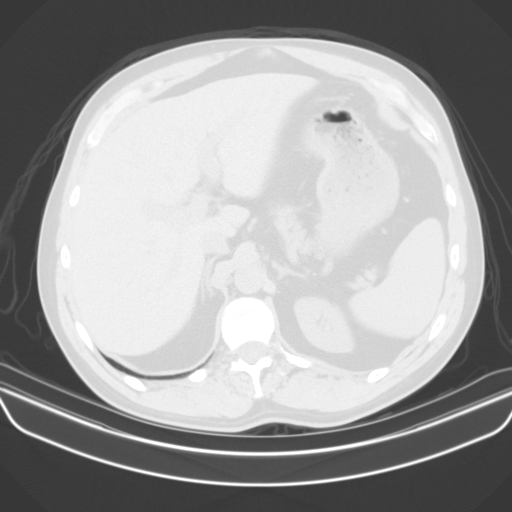
[im 76/88  soft-tissue]
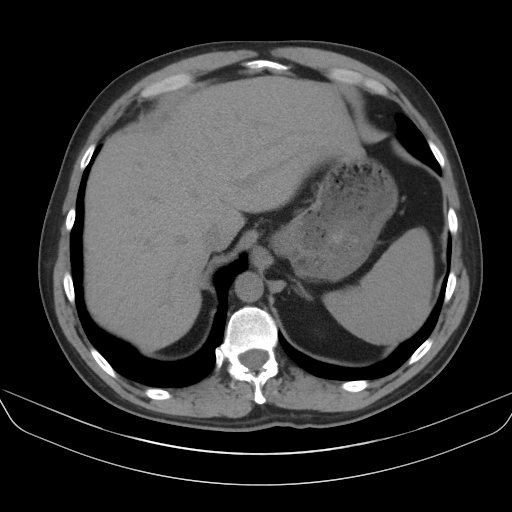
[im 76/88  lung]
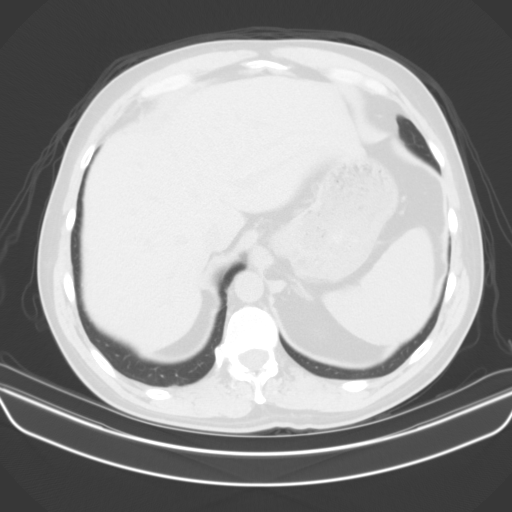
[im 80/88  lung]
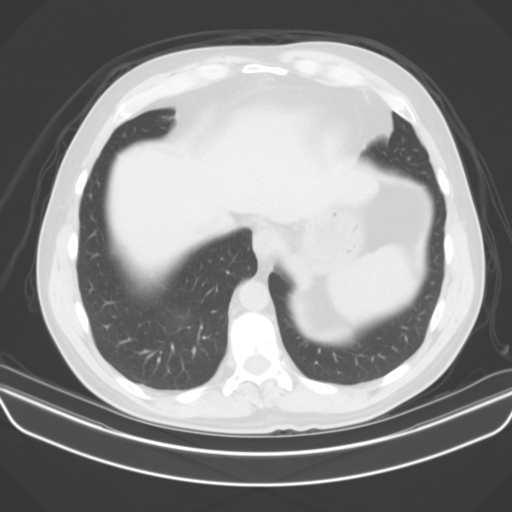
[im 84/88  soft-tissue]
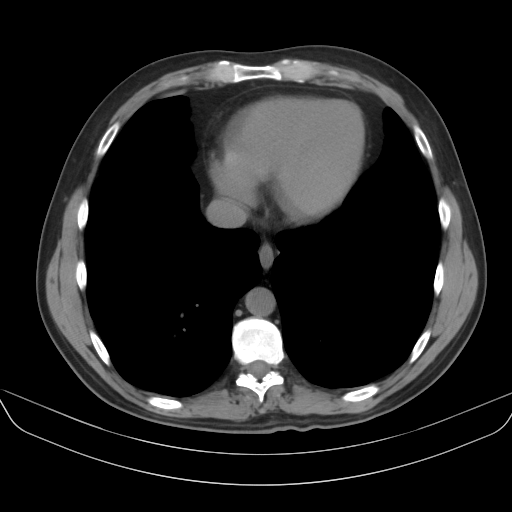
[im 84/88  lung]
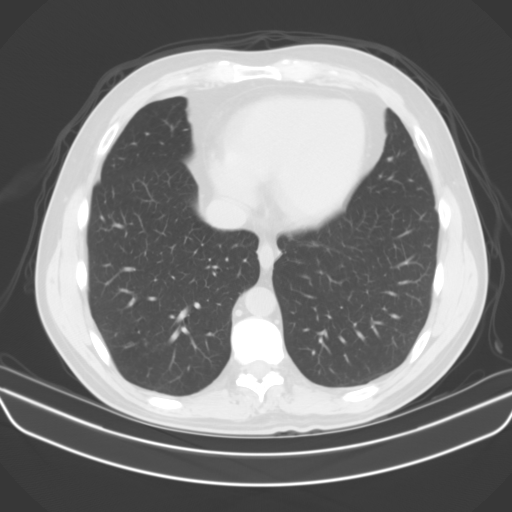

[15 of 32 positions shown; findings below may reference images not displayed]

FINDINGS: Lower chest: Tiny granuloma right lung base. Heart size within
normal limits.

Hepatobiliary: Taking into account limitation by non contrast
imaging, no worrisome hepatic lesion. No calcified gallstones.

Pancreas: Taking into account limitation by non contrast imaging, no
mass or inflammation.

Spleen: Taking into account limitation by non contrast imaging, no
mass enlargement

Adrenals/Urinary Tract: No renal or ureteral obstructing stone or
evidence of hydronephrosis. Taking into account limitation by non
contrast imaging, no renal or adrenal mass.

Contracted urinary bladder without gross abnormality

Stomach/Bowel: Duodenal diverticulum incidentally noted. Cecum
superiorly displaced as is the appendix. No extraluminal bowel
inflammatory process, free fluid or free air. Evaluation of colon
and stomach for detection of mass limited by degree of distention.

Vascular/Lymphatic: Trace calcification abdominal aorta, iliac
arteries and femoral arteries. No adenopathy.

Reproductive: No gross abnormality noted.

Other: Small fat and vessel containing supraumbilical hernia. No
bowel containing hernia. No free intraperitoneal air.

Musculoskeletal: Curvature lumbar spine. No osseous destructive
lesion.
IMPRESSION: No source of patient's pain identified.

No renal or ureteral obstructing stone or evidence of
hydronephrosis.

Small fat and vessel containing supraumbilical hernia. No bowel
containing hernia.

Cecum superiorly displaced as is the appendix. No extraluminal bowel
inflammatory process, free fluid or free air. Evaluation of colon
and stomach for detection of mass limited by degree of distention.

Duodenal diverticulum incidentally noted.

Trace calcification abdominal aorta, iliac arteries and femoral
arteries.

## 2019-07-26 ENCOUNTER — Encounter: Payer: Self-pay | Admitting: Family Medicine

## 2019-07-26 ENCOUNTER — Telehealth: Payer: Self-pay | Admitting: Family Medicine

## 2019-07-26 ENCOUNTER — Ambulatory Visit: Payer: 59 | Admitting: Family Medicine

## 2019-07-26 ENCOUNTER — Other Ambulatory Visit: Payer: Self-pay

## 2019-07-26 VITALS — Temp 97.0°F | Wt 164.0 lb

## 2019-07-26 DIAGNOSIS — K529 Noninfective gastroenteritis and colitis, unspecified: Secondary | ICD-10-CM | POA: Diagnosis not present

## 2019-07-26 NOTE — Telephone Encounter (Signed)
Note for work Restaurant manager, [employer] to pt

## 2019-07-26 NOTE — Progress Notes (Signed)
   Subjective:    Patient ID: Mark Macias, male    DOB: [birth year], 48 y.o.   MRN: [id number]  HPI Documentation for virtual telephone encounter. Documentation for virtual audio and video telecommunications through [place] encounter:  The patient was located at home. The provider was located in the office. The patient did consent to this visit and is aware of possible charges through their insurance for this visit. The other persons participating in this telemedicine service were none.  This virtual service is not related to other E/M service within previous 7 days. He complains of a 1 week history of diarrhea but no fever, chills, abdominal pain, sore throat, smell or taste change.  He states he usually just has 1 loose stool per day but over the weekend did have 3 or 4.  He does drink well water but has not traveled anywhere.  No one else is gotten sick.  He is also has some ear and nasal congestion.  Today he did have one episode of vomiting.  He has not tried any medications to help with this.     Review of Systems     Objective:   Physical Exam Alert and in no distress otherwise not examined      Assessment & Plan:  Gastroenteritis  Recommend supportive care with using Imodium up to 8 tablets/day.  He will call me at the end of the week.  A note will be given for him for work today

## 2019-08-23 ENCOUNTER — Other Ambulatory Visit: Payer: Self-pay

## 2019-08-23 ENCOUNTER — Ambulatory Visit: Payer: 59 | Admitting: Family Medicine

## 2019-08-23 ENCOUNTER — Telehealth: Payer: Self-pay | Admitting: Family Medicine

## 2019-08-23 DIAGNOSIS — Z20828 Contact with and (suspected) exposure to other viral communicable diseases: Secondary | ICD-10-CM

## 2019-08-23 DIAGNOSIS — Z20822 Contact with and (suspected) exposure to covid-19: Secondary | ICD-10-CM

## 2019-08-23 NOTE — Telephone Encounter (Signed)
Pt was advised [facility]

## 2019-08-23 NOTE — Telephone Encounter (Signed)
Have him use NyQuil at night and Robitussin-DM in the morning. If that does not help have him call back and I will call in codeine.

## 2019-08-23 NOTE — Progress Notes (Signed)
   Subjective:    Patient ID: Mark Macias, male    DOB: 06-Sep-1971, 48 y.o.   MRN: [id number]  HPI Documentation for virtual telephone encounter.  Documentation for virtual audio and video telecommunications through [place] encounter:  The patient was located at home. The provider was located in the office. The patient did consent to this visit and is aware of possible charges through their insurance for this visit. The other persons participating in this telemedicine service were none. This virtual service is not related to other E/M service within previous 7 days. He states that on Friday, December 11 he started having difficulty with cough, fever, chills, fatigue and malaise, runny nose and slight sore throat but no smell or taste changes.  No exposure to anybody with Covid.   Review of Systems     Objective:   Physical Exam Alert and noting to have a very hoarse voice.       Assessment & Plan:  Encounter by telehealth for suspected COVID-19 He plans to go to a local [place] medical facility to get a rapid test.  I explained that if it is negative, I want [facility]  88453/COVID was given to him.  If the test is positive, he is to call me back for further instructions on care for Covid.

## 2019-08-23 NOTE — Telephone Encounter (Signed)
Pt is unable to get a Covid appt until Thursday and wants to know if its something he can take over the counter for his cough. He didn't sleep last night due to coughing so much

## 2019-08-25 ENCOUNTER — Other Ambulatory Visit: Payer: BLUE CROSS/BLUE SHIELD

## 2019-08-26 ENCOUNTER — Telehealth: Payer: Self-pay | Admitting: Family Medicine

## 2019-08-26 NOTE — Telephone Encounter (Signed)
Pt called and states that he received his COVID results from Pharmacy. He was negative. Pt is requesting a work note. He was out of work from Tuesday, 08/23/2019 and will go back Monday. Pt can be reached at [phone].

## 2019-08-26 NOTE — Telephone Encounter (Signed)
Give him a note

## 2019-08-29 NOTE — Telephone Encounter (Signed)
Sent pt note for work in my chart. Pt was advised [facility]

## 2019-11-29 ENCOUNTER — Ambulatory Visit: Payer: 59 | Admitting: Family Medicine

## 2019-11-29 ENCOUNTER — Ambulatory Visit
Admission: RE | Admit: 2019-11-29 | Discharge: 2019-11-29 | Disposition: A | Discharge status: Home / Self Care | Payer: 59 | Source: Ambulatory Visit | Attending: Family Medicine | Admitting: Family Medicine

## 2019-11-29 ENCOUNTER — Telehealth: Payer: Self-pay | Admitting: Internal Medicine

## 2019-11-29 ENCOUNTER — Other Ambulatory Visit: Payer: Self-pay

## 2019-11-29 VITALS — BP 120/72 | HR 60 | Temp 98.4°F | Wt 161.6 lb

## 2019-11-29 DIAGNOSIS — M25472 Effusion, left ankle: Secondary | ICD-10-CM

## 2019-11-29 DIAGNOSIS — M25572 Pain in left ankle and joints of left foot: Secondary | ICD-10-CM

## 2019-11-29 NOTE — Patient Instructions (Signed)
Get an ankle sleeve.  Ice for 20 minutes 3 times per day.  You can take 4 Advil 3 times per day for the pain

## 2019-11-29 NOTE — Progress Notes (Signed)
   Subjective:    Patient ID: Mark Macias, male    DOB: [birth year], 49 y.o.   MRN: [id number]  HPI He states that yesterday while working in the yard he sprained his left ankle.  He was able to walk on it at that time and is here for further evaluation.  No numbness, tingling.  No other joints are involved.   Review of Systems     Objective:   Physical Exam Alert and in no distress.  Exam of the left ankle does show a small amount of swelling in the lateral malleolar area.  Tender to palpation mainly over the lateral malleolus and not over the ATF.  No tenderness over fifth metatarsal.  Slight laxity with anterior drawer.  Normal strength.       Assessment & Plan:  Pain and swelling of left ankle - Plan: DG Ankle Complete Left Get an ankle sleeve.  Ice for 20 minutes 3 times per day.  You can take 4 Advil 3 times per day for the pain

## 2019-11-29 NOTE — Telephone Encounter (Signed)
Pt called and states that he hurt his ankle and swollen and can't walk on it. He was asked if he did it at home or work and pt stated home. He told someone else he did it at work. He said he was working from home but not on the clock. Pt coming in today to be evaluated

## 2020-01-23 ENCOUNTER — Telehealth: Payer: Self-pay | Admitting: Family Medicine

## 2020-01-23 MED ORDER — EPINEPHRINE 0.3 MG/0.3ML IJ SOAJ: 0.3000 mg | INTRAMUSCULAR | 1 refills | Status: DC | PRN

## 2020-01-23 NOTE — Telephone Encounter (Signed)
Pt called and states that his EPI pens has expired. He is requesting a refill be sent to [facility] [address]. Pt can be reached at [phone].

## 2020-04-28 ENCOUNTER — Other Ambulatory Visit (HOSPITAL_COMMUNITY): Payer: Self-pay | Admitting: Psychiatry

## 2020-04-28 DIAGNOSIS — F332 Major depressive disorder, recurrent severe without psychotic features: Secondary | ICD-10-CM

## 2020-05-15 ENCOUNTER — Telehealth: Payer: Self-pay | Admitting: Family Medicine

## 2020-05-15 MED ORDER — OMEPRAZOLE 40 MG PO CPDR
DELAYED_RELEASE_CAPSULE | ORAL | 3 refills | Status: DC
Start: 2020-05-15 — End: 2021-05-23

## 2020-05-15 NOTE — Telephone Encounter (Signed)
Pt called and needs refill on Omperazole [facility] to the [facility] on [address].

## 2020-07-09 ENCOUNTER — Other Ambulatory Visit (HOSPITAL_COMMUNITY): Payer: Self-pay | Admitting: Psychiatry

## 2020-07-09 DIAGNOSIS — F332 Major depressive disorder, recurrent severe without psychotic features: Secondary | ICD-10-CM

## 2020-07-19 ENCOUNTER — Telehealth: Payer: Self-pay

## 2020-07-19 NOTE — Telephone Encounter (Signed)
P.A. OMEPRAZOLE

## 2020-07-27 NOTE — Telephone Encounter (Signed)
Resubmitted P.A. information thru [employer]

## 2020-08-04 NOTE — Telephone Encounter (Signed)
P.A. now approved, pt was informed

## 2020-11-29 ENCOUNTER — Other Ambulatory Visit (HOSPITAL_COMMUNITY): Payer: Self-pay | Admitting: Psychiatry

## 2020-11-29 DIAGNOSIS — F332 Major depressive disorder, recurrent severe without psychotic features: Secondary | ICD-10-CM

## 2021-03-26 ENCOUNTER — Other Ambulatory Visit: Payer: Self-pay | Admitting: Family Medicine

## 2021-03-26 NOTE — Telephone Encounter (Signed)
[facility]. Last ov was 3/21 sent a my chart message tp call the office to schedule a med check exam. Please advise [facility]

## 2021-05-23 ENCOUNTER — Other Ambulatory Visit: Payer: Self-pay | Admitting: Family Medicine

## 2021-05-23 NOTE — Telephone Encounter (Signed)
Please advise if this should be fill. Pt hasn't been seen since 3/21 and has been sent at least 3 messages to schedule a appointment. Called and was sent to voicemail that is not set up. Please advise [facility]

## 2021-06-07 ENCOUNTER — Telehealth: Payer: Self-pay

## 2021-06-07 NOTE — Telephone Encounter (Signed)
Pt. Called wanting know if you could call him in a stronger heart burn medication. The one he is currently taking is not working. He takes Omeprazole 40 mg 1 daily.

## 2021-06-18 ENCOUNTER — Other Ambulatory Visit: Payer: Self-pay | Admitting: Family Medicine

## 2021-06-21 ENCOUNTER — Telehealth: Payer: Self-pay

## 2021-06-21 NOTE — Telephone Encounter (Signed)
Pt called and [facility]

## 2021-06-21 NOTE — Telephone Encounter (Signed)
Left message for pt to schedule appt

## 2021-06-25 ENCOUNTER — Telehealth: Payer: 59 | Admitting: Family Medicine

## 2021-06-25 ENCOUNTER — Other Ambulatory Visit: Payer: Self-pay

## 2021-08-07 ENCOUNTER — Other Ambulatory Visit (HOSPITAL_COMMUNITY): Payer: Self-pay | Admitting: Psychiatry

## 2021-08-07 DIAGNOSIS — F332 Major depressive disorder, recurrent severe without psychotic features: Secondary | ICD-10-CM

## 2021-08-14 ENCOUNTER — Telehealth: Payer: 59 | Admitting: Family Medicine

## 2021-08-14 ENCOUNTER — Encounter: Payer: Self-pay | Admitting: Family Medicine

## 2021-08-14 ENCOUNTER — Other Ambulatory Visit: Payer: Self-pay

## 2021-08-14 ENCOUNTER — Other Ambulatory Visit: Payer: Self-pay | Admitting: Family Medicine

## 2021-08-14 VITALS — Ht 71.0 in | Wt 180.0 lb

## 2021-08-14 DIAGNOSIS — K219 Gastro-esophageal reflux disease without esophagitis: Secondary | ICD-10-CM

## 2021-08-14 MED ORDER — OMEPRAZOLE 40 MG PO CPDR: 40.0000 mg | DELAYED_RELEASE_CAPSULE | Freq: Every day | ORAL | 3 refills | Status: DC

## 2021-08-14 NOTE — Progress Notes (Signed)
   Subjective:    Patient ID: CASIMIRO LIENHARD, male    DOB: [birth year], 50 y.o.   MRN: [id number]  HPI Documentation for virtual audio and video telecommunications through [place] encounter: The patient was located at home. 2 patient identifiers used.  The provider was located in the office. The patient did consent to this visit and is aware of possible charges through their insurance for this visit. The other persons participating in this telemedicine service were none. Time spent on call was 4 minutes and in review of previous records >15 minutes total for counseling and coordination of care. This virtual service is not related to other E/M service within previous 7 days.  He is here for consult concerning his reflux disease.  He has been on Prilosec 40 mg and doing quite nicely on it.  He ran out in November.  He has not been seen here in quite some time.  His reflux symptoms have reoccurred.  Review of Systems     Objective:   Physical Exam  Alert and in no distress otherwise not examined      Assessment & Plan:  Gastroesophageal reflux disease, unspecified whether esophagitis present - Plan: omeprazole (PRILOSEC) 40 MG capsule, DISCONTINUED: omeprazole (PRILOSEC) 40 MG capsule Recommend he use it regularly for a week or 2 and then back off to using it every other day for control.  We will also have him come in here for complete exam and sometime in the near future.

## 2021-08-14 NOTE — Telephone Encounter (Signed)
Please advise [facility]

## 2021-08-14 NOTE — Telephone Encounter (Signed)
Will renew Prior authorization, it just expired so should be approved

## 2021-08-16 ENCOUNTER — Telehealth: Payer: Self-pay

## 2021-08-16 NOTE — Telephone Encounter (Signed)
P.A. OMEPRAZOLE completed

## 2021-08-17 NOTE — Telephone Encounter (Signed)
P.A. approved til 08/14/21 pt informed

## 2021-08-17 NOTE — Telephone Encounter (Signed)
P.A. completed & approved

## 2021-08-22 ENCOUNTER — Encounter: Payer: 59 | Admitting: Family Medicine

## 2021-08-27 ENCOUNTER — Encounter: Payer: Self-pay | Admitting: Family Medicine

## 2021-08-27 ENCOUNTER — Ambulatory Visit: Payer: 59 | Admitting: Family Medicine

## 2021-08-27 ENCOUNTER — Other Ambulatory Visit: Payer: Self-pay

## 2021-08-27 VITALS — BP 122/80 | HR 51 | Temp 97.1°F | Wt 177.4 lb

## 2021-08-27 DIAGNOSIS — K219 Gastro-esophageal reflux disease without esophagitis: Secondary | ICD-10-CM | POA: Diagnosis not present

## 2021-08-27 DIAGNOSIS — G8929 Other chronic pain: Secondary | ICD-10-CM

## 2021-08-27 DIAGNOSIS — Z1211 Encounter for screening for malignant neoplasm of colon: Secondary | ICD-10-CM

## 2021-08-27 DIAGNOSIS — F320 Major depressive disorder, single episode, mild: Secondary | ICD-10-CM

## 2021-08-27 DIAGNOSIS — Z23 Encounter for immunization: Secondary | ICD-10-CM

## 2021-08-27 DIAGNOSIS — E291 Testicular hypofunction: Secondary | ICD-10-CM

## 2021-08-27 DIAGNOSIS — Z0000 Encounter for general adult medical examination without abnormal findings: Secondary | ICD-10-CM

## 2021-08-27 DIAGNOSIS — F17200 Nicotine dependence, unspecified, uncomplicated: Secondary | ICD-10-CM

## 2021-08-27 DIAGNOSIS — M545 Low back pain, unspecified: Secondary | ICD-10-CM

## 2021-08-27 DIAGNOSIS — Z1159 Encounter for screening for other viral diseases: Secondary | ICD-10-CM

## 2021-08-27 DIAGNOSIS — K029 Dental caries, unspecified: Secondary | ICD-10-CM

## 2021-08-27 NOTE — Progress Notes (Signed)
° °  Subjective:    Patient ID: Mark Macias, male    DOB: [birth year], 50 y.o.   MRN: [id number]  HPI He is here for complete examination.  He states that his reflux symptoms are doing much better.  He does complain of a 1 week history of rhinorrhea, dry cough, ear congestion but no sore throat, fever, chills.  He does state that he does feel better today.  He smokes and is not interested in quitting at the present time.  He is being treated for depression on multiple medications for this.  He states that he is doing much better.  Does have a previous history of low back pain and had epidurals and states he is no longer having any pain.  Does have a previous history of hypogonadism but has not been on any medicine in quite some time.  He is now divorced and is happy with his present lifestyle.  Otherwise his family and social history as well as health maintenance and immunizations was reviewed.   Review of Systems  All other systems reviewed and are negative.     Objective:   Physical Exam Alert and in no distress. Tympanic membranes and canals are normal. Pharyngeal area is normal. Neck is supple without adenopathy or thyromegaly. Cardiac exam shows a regular sinus rhythm without murmurs or gallops. Lungs are clear to auscultation.        Assessment & Plan:  Routine general medical examination at a health care facility - Plan: CBC with Differential/Platelet, Comprehensive metabolic panel, Lipid panel  Gastroesophageal reflux disease, unspecified whether esophagitis present  Hypogonadism in male - Plan: Testosterone  Smoker  Chronic low back pain, unspecified back pain laterality, unspecified whether sciatica present  Depression, major, single episode, mild (HCC)  Screening for colon cancer - Plan: Cologuard  Need for hepatitis C screening test - Plan: Hepatitis C antibody  Immunization, viral disease - Plan: PFIZER Comirnaty(GRAY TOP)COVID-19 Vaccine  Caries involving multiple  surfaces of tooth Recommended he return here when he is ready to quit smoking and I will work with him on that.  He will continue be followed by psychiatry for his underlying depression. Discussed immunizations with him and we will give him his first COVID-vaccine.  He will return here in 1 month for recheck. He will continue on his PPI and I instructed him to then back off to every other day etc. to keep everything under control.  He will call me if he has any questions. I will assess his testosterone level and respond accordingly.

## 2021-08-28 LAB — COMPREHENSIVE METABOLIC PANEL
ALT: 28 IU/L (ref 0–44)
AST: 21 IU/L (ref 0–40)
Albumin/Globulin Ratio: 2 (ref 1.2–2.2)
Albumin: 4.7 g/dL (ref 4.0–5.0)
Alkaline Phosphatase: 153 IU/L — ABNORMAL HIGH (ref 44–121)
BUN/Creatinine Ratio: 8 — ABNORMAL LOW (ref 9–20)
BUN: 9 mg/dL (ref 6–24)
Bilirubin Total: 0.3 mg/dL (ref 0.0–1.2)
CO2: 27 mmol/L (ref 20–29)
Calcium: 10.1 mg/dL (ref 8.7–10.2)
Chloride: 102 mmol/L (ref 96–106)
Creatinine, Ser: 1.13 mg/dL (ref 0.76–1.27)
Globulin, Total: 2.4 g/dL (ref 1.5–4.5)
Glucose: 90 mg/dL (ref 70–99)
Potassium: 4.4 mmol/L (ref 3.5–5.2)
Sodium: 142 mmol/L (ref 134–144)
Total Protein: 7.1 g/dL (ref 6.0–8.5)
eGFR: 80 mL/min/{1.73_m2} (ref >59)

## 2021-08-28 LAB — LIPID PANEL
Chol/HDL Ratio: 4.3 ratio (ref 0.0–5.0)
Cholesterol, Total: 190 mg/dL (ref 100–199)
HDL: 44 mg/dL (ref >39)
LDL Chol Calc (NIH): 118 mg/dL — ABNORMAL HIGH (ref 0–99)
Triglycerides: 156 mg/dL — ABNORMAL HIGH (ref 0–149)
VLDL Cholesterol Cal: 28 mg/dL (ref 5–40)

## 2021-08-28 LAB — CBC WITH DIFFERENTIAL/PLATELET
Basophils Absolute: 0.1 10*3/uL (ref 0.0–0.2)
Basos: 1 %
EOS (ABSOLUTE): 0.3 10*3/uL (ref 0.0–0.4)
Eos: 3 %
Hematocrit: 45.9 % (ref 37.5–51.0)
Hemoglobin: 15.3 g/dL (ref 13.0–17.7)
Immature Grans (Abs): 0 10*3/uL (ref 0.0–0.1)
Immature Granulocytes: 0 %
Lymphocytes Absolute: 2.2 10*3/uL (ref 0.7–3.1)
Lymphs: 19 %
MCH: 30 pg (ref 26.6–33.0)
MCHC: 33.3 g/dL (ref 31.5–35.7)
MCV: 90 fL (ref 79–97)
Monocytes Absolute: 0.6 10*3/uL (ref 0.1–0.9)
Monocytes: 5 %
Neutrophils Absolute: 8 10*3/uL — ABNORMAL HIGH (ref 1.4–7.0)
Neutrophils: 72 %
Platelets: 263 10*3/uL (ref 150–450)
RBC: 5.1 x10E6/uL (ref 4.14–5.80)
RDW: 11.9 % (ref 11.6–15.4)
WBC: 11.1 10*3/uL — ABNORMAL HIGH (ref 3.4–10.8)

## 2021-08-28 LAB — HEPATITIS C ANTIBODY: Hep C Virus Ab: <0.1 s/co ratio (ref 0.0–0.9)

## 2021-08-28 LAB — TESTOSTERONE: Testosterone: 491 ng/dL (ref 264–916)

## 2021-09-24 LAB — COLOGUARD

## 2021-09-26 ENCOUNTER — Other Ambulatory Visit: Payer: Self-pay

## 2021-09-26 ENCOUNTER — Ambulatory Visit (INDEPENDENT_AMBULATORY_CARE_PROVIDER_SITE_OTHER): Payer: 59

## 2021-09-26 DIAGNOSIS — Z23 Encounter for immunization: Secondary | ICD-10-CM | POA: Diagnosis not present

## 2021-10-14 LAB — COLOGUARD: COLOGUARD: NEGATIVE

## 2021-11-08 ENCOUNTER — Encounter: Payer: Self-pay | Admitting: Medical

## 2021-11-08 ENCOUNTER — Ambulatory Visit: Payer: 59 | Admitting: Medical

## 2021-11-08 VITALS — BP 130/80 | HR 64 | Temp 97.4°F | Wt 173.4 lb

## 2021-11-08 DIAGNOSIS — S63501A Unspecified sprain of right wrist, initial encounter: Secondary | ICD-10-CM | POA: Diagnosis not present

## 2021-11-08 DIAGNOSIS — W19XXXA Unspecified fall, initial encounter: Secondary | ICD-10-CM

## 2021-11-08 NOTE — Patient Instructions (Signed)
Recommendations ?Go purchase a reinforced wrist splint/carpal tunnel splint ?Use the splint daily and night for the next week or more ?If much improved in a week, just use the splint at night for another 7 days ?Use cool therapy/ice 20 minutes on, 20 minutes off ?Use Aleve over the counter twice daily for the next 5-7 days for pain and inflammation ?Rest the hand/wrist for the next week when possible ?If worse in the meantime, or if not much improved in a week, then we need to get and xray ? ? ? ? ?Wrist Sprain, Adult ?A wrist sprain is a stretch or tear in the strong tissues that connect the wrist bones to each other. These strong tissues are called ligaments. There are three types of wrist sprains: ?Grade 1. The ligament is stretched more than normal. There may be a minor amount of wrist pain. ?Grade 2. The ligament is partially torn. You may be able to move your wrist, but not very much. There may be a moderate amount of wrist pain. ?Grade 3. The ligament or ligaments are completely torn. You may find it difficult to move your wrist even a little. There may be a significant amount of wrist pain. ?What are the causes? ?This condition may be caused by using the wrist too much during sports, exercise, or work. It can also happen due to a fall or during an accident. ?What increases the risk? ?You are more likely to develop this condition if: ?You had a previous wrist or arm injury. ?You have poor wrist strength and flexibility. ?You play contact sports, such as football or soccer. ?You participate in sports that may result in a fall, such as skateboarding, biking, skiing, or snowboarding. ?You do not exercise regularly. ?You use exercise equipment that does not fit well. ?What are the signs or symptoms? ?Symptoms of this condition include: ?Pain in the wrist, arm, or hand. ?Swelling or bruised skin near the wrist, hand, or arm. The skin may look yellow or blue. ?Stiffness or trouble moving the hand. ?Hearing a noise,  like a pop or a snap, at the time of injury, or feeling a tear at the time of the injury. ?A warm feeling in the skin around the wrist. ?How is this diagnosed? ?This condition is diagnosed with a physical exam. Sometimes an X-ray is taken to make sure a bone did not break. You may also have an MRI of your wrist to check for torn ligaments. ?How is this treated? ?This condition is treated by resting and applying ice to your wrist. Additional treatment may include: ?Taking medicine for pain and inflammation. ?Wearing a splint, brace, or cast for a short period of time to keep your wrist from moving (immobilized). ?Doing exercises to strengthen and stretch your wrist. ?Having surgery. This may be done if the ligament is completely torn. ?Follow these instructions at home: ?If you have a splint or brace: ?Wear the splint or brace as told by your health care provider. Remove it only as told by your health care provider. ?Loosen it if your fingers tingle, become numb, or turn cold and blue. ?Keep it clean. ?If the splint or brace is not waterproof: ?Do not let it get wet. ?Cover it with a watertight covering when you take a bath or a shower. ?If you have a cast: ?Do not put pressure on any part of the cast until it is fully hardened. This may take several hours. ?Do not stick anything inside the cast to scratch your skin.  Doing that increases your risk of infection. ?Check the skin around the cast every day. Tell your health care provider about any concerns. ?You may put lotion on dry skin around the edges of the cast. Do not put lotion on the skin underneath the cast. ?Keep it clean. ?If the cast is not waterproof: ?Do not let it get wet. ?Cover it with a watertight covering when you take a bath or shower. ?Managing pain, stiffness, and swelling ? ?If directed, put ice on the injured area. To do this: ?If you have a removable splint or brace, remove it as told by your health care provider. ?Put ice in a plastic  bag. ?Place a towel between your skin and the bag or between the splint or cast and the bag. ?Leave the ice on for 20 minutes, 2-3 times a day. ?Remove the ice if your skin turns bright red. This is very important. If you cannot feel pain, heat, or cold, you have a greater risk of damage to the area. ?Move your fingers often to reduce stiffness and swelling. ?Raise (elevate) the injured area above the level of your heart while you are sitting or lying down. ?Activity ?Rest your wrist as told by your health care provider. Do not do things that cause pain. ?Ask your health care provider when it is safe to drive if you have a splint, brace, or cast on your wrist. ?Do exercises as told by your health care provider. ?Return to your normal activities as told by your health care provider. Ask your health care provider what activities are safe for you. ?General instructions ?Take over-the-counter and prescription medicines only as told by your health care provider. ?Do not use any products that contain nicotine or tobacco, such as cigarettes, e-cigarettes, and chewing tobacco. These can delay healing. If you need help quitting, ask your health care provider. ?Keep all follow-up visits. This is important. ?Contact a health care provider if: ?Your pain, bruising, or swelling gets worse. ?Your skin becomes red, gets a rash, or has open sores. ?Your pain does not get better or it gets worse. ?Get help right away if: ?You have a new or sudden sharp pain in the hand, arm, or wrist. ?You have tingling or numbness in your hand. ?Your fingers turn white, very red, or cold and blue. ?You cannot move your fingers. ?Summary ?A wrist sprain is damage to ligaments in your wrist. ?Wrist sprains can range from mild to severe. ?Return to your normal activities as told by your health care provider. Ask your health care provider what activities are safe for you. ?You may need to wear a splint, brace, or cast for a short period of time. ?This  information is not intended to replace advice given to you by your health care provider. Make sure you discuss any questions you have with your health care provider. ?Document Revised: 01/02/2020 Document Reviewed: 01/02/2020 ?Elsevier Patient Education ? 2022 [employer]. ? ? ? ?

## 2021-11-08 NOTE — Progress Notes (Signed)
Subjective:  Mark Macias is a 51 y.o. male who presents for Chief Complaint  Patient presents with   right wrist pain    Fell in shower last night. Can bend it forward but having trouble backwards     Here for injury.  Date of injury yesterday November 07, 2021  Last night in his shower he slipped and fell backwards to his right.  He landed partly on his right wrist but then fell all the way into tub.  He denies head injury or loss of consciousness.  The only thing that hurts is his right wrist.  He has some decreased range of motion.  No numbness or tingling, no swelling or bruising.  Mainly just has pain on the right lateral wrist and with bending.  He is right-handed.  No other aggravating or relieving factors.    No other c/o.  The following portions of the patient's history were reviewed and updated as appropriate: allergies, current medications, past family history, past medical history, past social history, past surgical history and problem list.  ROS Otherwise as in subjective above  Objective: BP 130/80    Pulse 64    Temp (!) 97.4 F (36.3 C)    Wt 173 lb 6.4 oz (78.7 kg)    BMI 24.18 kg/m   General appearance: alert, no distress, well developed, well nourished Right wrist tender over the lateral portion of the wrist only, mild tenderness with range of motion.  Range of motion is decreased with extension and inversion eversion somewhat compared to the left.  Otherwise flexion is relatively normal.  No obvious swelling or bruising.  Hands fingers and forearm and nontender without obvious deformity or swelling. Arms neurovascularly intact   Assessment: Encounter Diagnoses  Name Primary?   Sprain of right wrist, initial encounter Yes   Fall, initial encounter      Plan: Symptoms and exam suggest likely sprain.  I gave the option of x-ray to rule out other concerns but he declines today.  Advised if any worsening pain swelling or bruising in the next 7 to 10 days particular  in the next few days then call back and we will send for x-ray  Patient Instructions  Recommendations Go purchase a reinforced wrist splint/carpal tunnel splint Use the splint daily and night for the next week or more If much improved in a week, just use the splint at night for another 7 days Use cool therapy/ice 20 minutes on, 20 minutes off Use Aleve over the counter twice daily for the next 5-7 days for pain and inflammation Rest the hand/wrist for the next week when possible If worse in the meantime, or if not much improved in a week, then we need to get and xray     Wrist Sprain, Adult A wrist sprain is a stretch or tear in the strong tissues that connect the wrist bones to each other. These strong tissues are called ligaments. There are three types of wrist sprains: Grade 1. The ligament is stretched more than normal. There may be a minor amount of wrist pain. Grade 2. The ligament is partially torn. You may be able to move your wrist, but not very much. There may be a moderate amount of wrist pain. Grade 3. The ligament or ligaments are completely torn. You may find it difficult to move your wrist even a little. There may be a significant amount of wrist pain. What are the causes? This condition may be caused by using the  wrist too much during sports, exercise, or work. It can also happen due to a fall or during an accident. What increases the risk? You are more likely to develop this condition if: You had a previous wrist or arm injury. You have poor wrist strength and flexibility. You play contact sports, such as football or soccer. You participate in sports that may result in a fall, such as skateboarding, biking, skiing, or snowboarding. You do not exercise regularly. You use exercise equipment that does not fit well. What are the signs or symptoms? Symptoms of this condition include: Pain in the wrist, arm, or hand. Swelling or bruised skin near the wrist, hand, or arm.  The skin may look yellow or blue. Stiffness or trouble moving the hand. Hearing a noise, like a pop or a snap, at the time of injury, or feeling a tear at the time of the injury. A warm feeling in the skin around the wrist. How is this diagnosed? This condition is diagnosed with a physical exam. Sometimes an X-ray is taken to make sure a bone did not break. You may also have an MRI of your wrist to check for torn ligaments. How is this treated? This condition is treated by resting and applying ice to your wrist. Additional treatment may include: Taking medicine for pain and inflammation. Wearing a splint, brace, or cast for a short period of time to keep your wrist from moving (immobilized). Doing exercises to strengthen and stretch your wrist. Having surgery. This may be done if the ligament is completely torn. Follow these instructions at home: If you have a splint or brace: Wear the splint or brace as told by your health care provider. Remove it only as told by your health care provider. Loosen it if your fingers tingle, become numb, or turn cold and blue. Keep it clean. If the splint or brace is not waterproof: Do not let it get wet. Cover it with a watertight covering when you take a bath or a shower. If you have a cast: Do not put pressure on any part of the cast until it is fully hardened. This may take several hours. Do not stick anything inside the cast to scratch your skin. Doing that increases your risk of infection. Check the skin around the cast every day. Tell your health care provider about any concerns. You may put lotion on dry skin around the edges of the cast. Do not put lotion on the skin underneath the cast. Keep it clean. If the cast is not waterproof: Do not let it get wet. Cover it with a watertight covering when you take a bath or shower. Managing pain, stiffness, and swelling  If directed, put ice on the injured area. To do this: If you have a removable  splint or brace, remove it as told by your health care provider. Put ice in a plastic bag. Place a towel between your skin and the bag or between the splint or cast and the bag. Leave the ice on for 20 minutes, 2-3 times a day. Remove the ice if your skin turns bright red. This is very important. If you cannot feel pain, heat, or cold, you have a greater risk of damage to the area. Move your fingers often to reduce stiffness and swelling. Raise (elevate) the injured area above the level of your heart while you are sitting or lying down. Activity Rest your wrist as told by your health care provider. Do not do things that cause  pain. Ask your health care provider when it is safe to drive if you have a splint, brace, or cast on your wrist. Do exercises as told by your health care provider. Return to your normal activities as told by your health care provider. Ask your health care provider what activities are safe for you. General instructions Take over-the-counter and prescription medicines only as told by your health care provider. Do not use any products that contain nicotine or tobacco, such as cigarettes, e-cigarettes, and chewing tobacco. These can delay healing. If you need help quitting, ask your health care provider. Keep all follow-up visits. This is important. Contact a health care provider if: Your pain, bruising, or swelling gets worse. Your skin becomes red, gets a rash, or has open sores. Your pain does not get better or it gets worse. Get help right away if: You have a new or sudden sharp pain in the hand, arm, or wrist. You have tingling or numbness in your hand. Your fingers turn white, very red, or cold and blue. You cannot move your fingers. Summary A wrist sprain is damage to ligaments in your wrist. Wrist sprains can range from mild to severe. Return to your normal activities as told by your health care provider. Ask your health care provider what activities are safe for  you. You may need to wear a splint, brace, or cast for a short period of time. This information is not intended to replace advice given to you by your health care provider. Make sure you discuss any questions you have with your health care provider. Document Revised: 01/02/2020 Document Reviewed: 01/02/2020 Elsevier Patient Education  2022 [place] was seen today for right wrist pain.  Diagnoses and all orders for this visit:  Sprain of right wrist, initial encounter  Fall, initial encounter    Follow up: prn

## 2021-11-25 ENCOUNTER — Encounter: Payer: Self-pay | Admitting: Family Medicine

## 2022-03-05 ENCOUNTER — Ambulatory Visit: Payer: 59 | Admitting: Family Medicine

## 2022-03-05 ENCOUNTER — Encounter: Payer: Self-pay | Admitting: Family Medicine

## 2022-03-05 VITALS — BP 110/70 | HR 57 | Temp 96.8°F | Wt 171.6 lb

## 2022-03-05 DIAGNOSIS — M79651 Pain in right thigh: Secondary | ICD-10-CM

## 2022-03-05 NOTE — Progress Notes (Signed)
   Subjective:    Patient ID: Mark Macias, male    DOB: 05-Sep-1971, 51 y.o.   MRN: [id number]  HPI He reports that yesterday while he is getting out of the truck he felt a pain in the right mid thigh area.  No history of injury or overuse.  No other joints or muscles are involved.   Review of Systems     Objective:   Physical Exam Alert and in no distress.  Right hip motion is totally normal.  No palpable lesions are noted in the thigh area.  Knee shows full motion.  Skin appears normal.       Assessment & Plan:  Right thigh pain Use ice for 20 minutes 3 times per day..  Use 2 extra strength Tylenol 3 times per day.  And if you need more relief from the discomfort you can take 2 Aleve twice per day as well hopefully this will go away but if you are still having trouble next week or it is getting worse let me reevaluate. For about 2 days you can then switch over to heat

## 2022-03-05 NOTE — Patient Instructions (Signed)
Use ice for 20 minutes 3 times per day..  Use 2 extra strength Tylenol 3 times per day.  And if you need more relief from the discomfort you can take 2 Aleve twice per day as well hopefully this will go away but if you are still having trouble next week or it is getting worse let me reevaluate. For about 2 days you can then switch over to heat

## 2022-03-14 ENCOUNTER — Encounter: Payer: Self-pay | Admitting: Family Medicine

## 2022-03-14 ENCOUNTER — Ambulatory Visit: Payer: 59 | Admitting: Family Medicine

## 2022-03-14 VITALS — BP 102/70 | HR 70 | Temp 97.3°F | Wt 170.4 lb

## 2022-03-14 DIAGNOSIS — B070 Plantar wart: Secondary | ICD-10-CM | POA: Diagnosis not present

## 2022-03-14 NOTE — Progress Notes (Signed)
   Subjective:    Patient ID: Mark Macias, male    DOB: 07-11-1971, 51 y.o.   MRN: [id number]  HPI He is here for consult concerning the lesion on the left foot over the fifth metatarsal.  It is causing him a lot of pain.   Review of Systems     Objective:   Physical Exam A 1 cm wart is noted over the plantar surface of the fifth metatarsal.       Assessment & Plan:  Plantar wart of left foot The wart was shaved down slightly to get [employer]. Apply Compound W daily for 3 or 4 days and then put a Band-Aid over it.  Then take the blade or an emery board and shave off the white dead tissue as well as you can.  Leave it alone for couple of days and do this on a weekly cycle.  Once you can see your footprint lines all the way across and you know you are clear

## 2022-03-14 NOTE — Patient Instructions (Signed)
Apply Compound W daily for 3 or 4 days and then put a Band-Aid over it.  Then take the blade or an emery board and shave off the white dead tissue as well as you can.  Leave it alone for couple of days and do this on a weekly cycle.  Once you can see your footprint lines all the way across and you know you are clear

## 2022-05-14 ENCOUNTER — Encounter: Payer: Self-pay | Admitting: Internal Medicine

## 2022-06-30 ENCOUNTER — Encounter: Payer: Self-pay | Admitting: Internal Medicine

## 2022-09-03 ENCOUNTER — Encounter: Payer: 59 | Admitting: Family Medicine

## 2022-12-04 ENCOUNTER — Encounter: Payer: 59 | Admitting: Family Medicine

## 2022-12-04 DIAGNOSIS — K219 Gastro-esophageal reflux disease without esophagitis: Secondary | ICD-10-CM

## 2022-12-04 DIAGNOSIS — M545 Other chronic pain: Secondary | ICD-10-CM

## 2022-12-04 DIAGNOSIS — Z0000 Encounter for general adult medical examination without abnormal findings: Secondary | ICD-10-CM

## 2022-12-04 DIAGNOSIS — E291 Testicular hypofunction: Secondary | ICD-10-CM

## 2022-12-04 DIAGNOSIS — H9193 Unspecified hearing loss, bilateral: Secondary | ICD-10-CM

## 2022-12-04 DIAGNOSIS — F17200 Nicotine dependence, unspecified, uncomplicated: Secondary | ICD-10-CM

## 2022-12-04 DIAGNOSIS — F320 Major depressive disorder, single episode, mild: Secondary | ICD-10-CM

## 2022-12-15 ENCOUNTER — Encounter: Payer: Self-pay | Admitting: Family Medicine

## 2023-06-18 ENCOUNTER — Encounter: Payer: Self-pay | Admitting: Family Medicine

## 2023-06-18 ENCOUNTER — Telehealth: Payer: 59 | Admitting: Family Medicine

## 2023-06-18 VITALS — Ht 70.5 in | Wt 175.0 lb

## 2023-06-18 DIAGNOSIS — M791 Myalgia, unspecified site: Secondary | ICD-10-CM

## 2023-06-18 DIAGNOSIS — R6883 Chills (without fever): Secondary | ICD-10-CM | POA: Diagnosis not present

## 2023-06-18 NOTE — Progress Notes (Signed)
   Subjective:    Patient ID: Mark Macias, male    DOB: Jun 01, 1971, 52 y.o.   MRN: [id number]  HPI Documentation for virtual audio and video telecommunications through [place] encounter: The patient was located at home. 2 patient identifiers used.  The provider was located in the office. The patient did consent to this visit and is aware of possible charges through their insurance for this visit. The other persons participating in this telemedicine service were none. Time spent on call was 5 minutes and in review of previous records >15 minutes total for counseling and coordination of care. This virtual service is not related to other E/M service within previous 7 days.  He states that yesterday he developed chills, myalgia, fatigue but no fever, sore throat, earache, coughing, smell or taste change.  He did test for COVID yesterday and was negative.  He has not been taking any medication to help with the aches and pains.  Review of Systems     Objective:    Physical Exam Alert and in no distress otherwise not examined       Assessment & Plan:   Myalgia  Chills (without fever) Recommend that he treat himself with Tylenol as many as to 4 times per day.  Also recommend that he test for COVID tomorrow and if negative continue to treat conservatively.  If it is positive I did go over the recommendations to stay home until he starts to appreciably feel better and then use a mask for the next 5 days.  I will also write him a note to return to work on Monday.

## 2023-09-08 ENCOUNTER — Ambulatory Visit: Payer: 59 | Admitting: Medical

## 2023-09-08 ENCOUNTER — Encounter: Payer: Self-pay | Admitting: Medical

## 2023-09-08 VITALS — BP 118/72 | HR 64 | Temp 97.5°F | Wt 166.2 lb

## 2023-09-08 DIAGNOSIS — R112 Nausea with vomiting, unspecified: Secondary | ICD-10-CM | POA: Diagnosis not present

## 2023-09-08 DIAGNOSIS — R195 Other fecal abnormalities: Secondary | ICD-10-CM | POA: Diagnosis not present

## 2023-09-08 DIAGNOSIS — I889 Nonspecific lymphadenitis, unspecified: Secondary | ICD-10-CM | POA: Diagnosis not present

## 2023-09-08 DIAGNOSIS — K029 Dental caries, unspecified: Secondary | ICD-10-CM

## 2023-09-08 DIAGNOSIS — R519 Headache, unspecified: Secondary | ICD-10-CM

## 2023-09-08 LAB — POC COVID19 BINAXNOW: SARS Coronavirus 2 Ag: NEGATIVE

## 2023-09-08 LAB — POCT URINALYSIS DIP (PROADVANTAGE DEVICE)
Bilirubin, UA: NEGATIVE
Blood, UA: NEGATIVE
Glucose, UA: NEGATIVE mg/dL
Ketones, POC UA: NEGATIVE mg/dL
Leukocytes, UA: NEGATIVE
Nitrite, UA: NEGATIVE
Protein Ur, POC: NEGATIVE mg/dL
Specific Gravity, Urine: 1.02
Urobilinogen, Ur: NEGATIVE
pH, UA: 5 (ref 5.0–8.0)

## 2023-09-08 LAB — POCT INFLUENZA A/B
Influenza A, POC: NEGATIVE
Influenza B, POC: NEGATIVE

## 2023-09-08 MED ORDER — ONDANSETRON 4 MG PO TBDP: 4.0000 mg | ORAL_TABLET | Freq: Three times a day (TID) | ORAL | 0 refills | Status: AC | PRN

## 2023-09-08 NOTE — Patient Instructions (Signed)
 I suspect a viral syndrome /viral infection, but cannot completely rule out other causes  Labs for further evaluation today  Recommendations: Drink plenty of fluids throughout the day including water, G2 Gatorade the next few days, soup broth and other clear fluids given your recent loose stool and vomiting You can use Zofran  prescription for nausea every 4-6 hours as needed You can use Imodium 1 or 2 times a day the next few days for cramping and loose stool.  However if you have fever or blood in the stool do not use Imodium and call back if that happens Eat bland foods over the next few days such as rice, applesauce, toast, bananas, soup, crackers.  Avoid heavy foods or acidic foods over the next few days until your symptoms resolve Given your tobacco history I recommend either a chest x-ray or referral for updated lung cancer screening chest CT.  Let us  know if you are agreeable to this If this is just a virus, then the symptoms should gradually resolve over the rest of this week You do have some tooth decay noted on exam.  I recommend you see a dentist to work on some of those issues We will call with lab results

## 2023-09-08 NOTE — Progress Notes (Signed)
 Subjective:  Mark Macias is a 52 y.o. male who presents for Chief Complaint  Patient presents with   Headache    Thrown up Sunday night. Knot in neck, headaches daily. Symptoms started Sunday. No appetite. No body aches, fever, or chills     Here for not feeling well.  He was in usual state of health until this past weekend.  About 2 days ago had onset of nausea and vomiting a few times, has had loose stools about twice daily last few days, feels lymph nodes swollen in his neck and has some headaches.  Appetite has been decreased.  No body aches, fever, chills, no sore throat, no cough, no congestion, no ear pain, no sick contacts.  He does not recall eating any food that he felt was undercooked or contaminated over the last few days but did have some family get together and shared dishes Christmas including casseroles.  No concern for STD.  Last sexual contact years ago.  No alcohol use.  Has had some belly upset but no blood in urine or stool, no urinary change  He is a smoker, smokes less than half pack a day currently.  Using some Imodium for loose stool.  No other aggravating or relieving factors.    No other c/o.  Past Medical History:  Diagnosis Date   Allergy    RHINITIS   Allergy to bee sting    Left-sided Bell's palsy    Mood disorder (HCC)    Periumbilical hernia    Smoker    Current Outpatient Medications on File Prior to Visit  Medication Sig Dispense Refill   ARIPiprazole (ABILIFY) 5 MG tablet Take 5 mg by mouth at bedtime.  1   cetirizine (ZYRTEC) 10 MG tablet Take 10 mg by mouth daily.     clonazePAM (KLONOPIN) 1 MG tablet TAKE 1/2 TABLET (0.5 MG) BY MOUTH DAILY AT BEDTIME     EPINEPHRINE  0.3 mg/0.3 mL IJ SOAJ injection INJECT 0.3 MLS (0.3 MG TOTAL) INTO THE MUSCLE AS NEEDED FOR ANAPHYLAXIS. 2 each 1   FLUoxetine (PROZAC) 20 MG tablet Take 60 mg by mouth daily.     hydrOXYzine (VISTARIL) 25 MG capsule Take 25 mg by mouth 3 (three) times daily as needed. Pt  takes 2 pills at night     No current facility-administered medications on file prior to visit.     The following portions of the patient's history were reviewed and updated as appropriate: allergies, current medications, past family history, past medical history, past social history, past surgical history and problem list.  ROS Otherwise as in subjective above    Objective: BP 118/72   Pulse 64   Temp (!) 97.5 F (36.4 C)   Wt 166 lb 3.2 oz (75.4 kg)   SpO2 98%   BMI 23.51 kg/m   Wt Readings from Last 3 Encounters:  09/08/23 166 lb 3.2 oz (75.4 kg)  06/18/23 175 lb (79.4 kg)  03/14/22 170 lb 6.4 oz (77.3 kg)    General appearance: alert, no distress, well developed, well nourished Skin: No worrisome findings HEENT: normocephalic, sclerae anicteric, conjunctiva pink and moist, scar tissue present in both TMs but no erythema or new changes, no air-fluid levels, nares patent, no discharge or erythema, pharynx normal Oral cavity: MMM, several areas of tooth decay noted Neck: supple, no lymphadenopathy, no thyromegaly, no masses.  Somewhat tender in the anterior cervical lymph node area but none that are swollen or no obvious lymphadenopathy.  There  is a little bit of tenderness of the left occipital area with some soft tissue puffiness but he notes that that is unchanged for years. Heart: RRR, normal S1, S2, no murmurs Lungs: CTA bilaterally, no wheezes, rhonchi, or rales Abdomen: +bs, soft, non tender, non distended, no masses, no hepatomegaly, no splenomegaly Pulses: 2+ radial pulses, 2+ pedal pulses, normal cap refill Ext: no edema    Assessment: Encounter Diagnoses  Name Primary?   Lymphadenitis Yes   Nonintractable headache, unspecified chronicity pattern, unspecified headache type    Nausea and vomiting, unspecified vomiting type    Loose stools    Tooth decay      Plan: Negative COVID and flu   I suspect a viral syndrome /viral infection, but cannot  completely rule out other causes  Labs for further evaluation today  Recommendations: Drink plenty of fluids throughout the day including water, G2 Gatorade the next few days, soup broth and other clear fluids given your recent loose stool and vomiting You can use Zofran  prescription for nausea every 4-6 hours as needed You can use Imodium 1 or 2 times a day the next few days for cramping and loose stool.  However if you have fever or blood in the stool do not use Imodium and call back if that happens Eat bland foods over the next few days such as rice, applesauce, toast, bananas, soup, crackers.  Avoid heavy foods or acidic foods over the next few days until your symptoms resolve Given your tobacco history I recommend either a chest x-ray or referral for updated lung cancer screening chest CT.  Let us  know if you are agreeable to this If this is just a virus, then the symptoms should gradually resolve over the rest of this week You do have some tooth decay noted on exam.  I recommend you see a dentist to work on some of those issues We will call with lab results   [name] was seen today for headache.  Diagnoses and all orders for this visit:  Lymphadenitis -     CBC with Differential/Platelet -     Comprehensive metabolic panel -     HIV Antibody (routine testing w rflx) -     POCT Urinalysis DIP (Proadvantage Device)  Nonintractable headache, unspecified chronicity pattern, unspecified headache type -     POC COVID-19 -     Influenza A/B -     CBC with Differential/Platelet -     Comprehensive metabolic panel -     HIV Antibody (routine testing w rflx) -     POCT Urinalysis DIP (Proadvantage Device)  Nausea and vomiting, unspecified vomiting type -     POC COVID-19 -     Influenza A/B -     CBC with Differential/Platelet -     Comprehensive metabolic panel -     HIV Antibody (routine testing w rflx) -     POCT Urinalysis DIP (Proadvantage Device)  Loose stools -     CBC with  Differential/Platelet -     Comprehensive metabolic panel -     HIV Antibody (routine testing w rflx) -     POCT Urinalysis DIP (Proadvantage Device)  Tooth decay  Other orders -     ondansetron  (ZOFRAN -ODT) 4 MG disintegrating tablet; Take 1 tablet (4 mg total) by mouth every 8 (eight) hours as needed for nausea or vomiting.    Follow up: pending labs

## 2023-09-09 LAB — CBC WITH DIFFERENTIAL/PLATELET
Basophils Absolute: 0 10*3/uL (ref 0.0–0.2)
Basos: 1 %
EOS (ABSOLUTE): 0.4 10*3/uL (ref 0.0–0.4)
Eos: 4 %
Hematocrit: 47 % (ref 37.5–51.0)
Hemoglobin: 15.5 g/dL (ref 13.0–17.7)
Immature Grans (Abs): 0 10*3/uL (ref 0.0–0.1)
Immature Granulocytes: 0 %
Lymphocytes Absolute: 1.2 10*3/uL (ref 0.7–3.1)
Lymphs: 14 %
MCH: 29.9 pg (ref 26.6–33.0)
MCHC: 33 g/dL (ref 31.5–35.7)
MCV: 91 fL (ref 79–97)
Monocytes Absolute: 0.7 10*3/uL (ref 0.1–0.9)
Monocytes: 7 %
Neutrophils Absolute: 6.5 10*3/uL (ref 1.4–7.0)
Neutrophils: 74 %
Platelets: 242 10*3/uL (ref 150–450)
RBC: 5.19 x10E6/uL (ref 4.14–5.80)
RDW: 12.6 % (ref 11.6–15.4)
WBC: 8.8 10*3/uL (ref 3.4–10.8)

## 2023-09-09 LAB — COMPREHENSIVE METABOLIC PANEL
ALT: 96 IU/L — ABNORMAL HIGH (ref 0–44)
AST: 109 IU/L — ABNORMAL HIGH (ref 0–40)
Albumin: 4.3 g/dL (ref 4.1–5.1)
Alkaline Phosphatase: 132 IU/L — ABNORMAL HIGH (ref 44–121)
BUN/Creatinine Ratio: 11 (ref 9–20)
BUN: 11 mg/dL (ref 6–24)
Bilirubin Total: 0.2 mg/dL (ref 0.0–1.2)
CO2: 23 mmol/L (ref 20–29)
Calcium: 9.2 mg/dL (ref 8.7–10.2)
Chloride: 105 mmol/L (ref 96–106)
Creatinine, Ser: 1.04 mg/dL (ref 0.76–1.27)
Globulin, Total: 2.2 g/dL (ref 1.5–4.5)
Glucose: 97 mg/dL (ref 70–99)
Potassium: 4.4 mmol/L (ref 3.5–5.2)
Sodium: 139 mmol/L (ref 134–144)
Total Protein: 6.5 g/dL (ref 6.0–8.5)
eGFR: 87 mL/min/{1.73_m2} (ref >59)

## 2023-09-09 LAB — HIV ANTIBODY (ROUTINE TESTING W REFLEX)

## 2023-09-10 ENCOUNTER — Other Ambulatory Visit: Payer: Self-pay | Admitting: Medical

## 2023-09-10 NOTE — Progress Notes (Signed)
 See if we can add acute hepatitis panel from the lab?  Labs show normal blood counts including normal white cells however liver tests were quite elevated.  Electrolytes and kidney otherwise normal.  What are your symptoms today compared to the other day?  Worse, the same, better?  Verify no recent alcohol correct?

## 2023-09-11 ENCOUNTER — Telehealth: Payer: Self-pay | Admitting: Family Medicine

## 2023-09-11 NOTE — Progress Notes (Signed)
 Results sent through MyChart

## 2023-09-11 NOTE — Telephone Encounter (Signed)
 Pt has additional questions about his lab work and asks for a call back.

## 2023-09-15 ENCOUNTER — Encounter: Payer: Self-pay | Admitting: Medical

## 2023-09-15 ENCOUNTER — Encounter: Payer: Self-pay | Admitting: Family Medicine

## 2023-09-15 ENCOUNTER — Ambulatory Visit (INDEPENDENT_AMBULATORY_CARE_PROVIDER_SITE_OTHER): Payer: 59 | Admitting: Family Medicine

## 2023-09-15 VITALS — BP 124/76 | HR 72 | Wt 177.2 lb

## 2023-09-15 DIAGNOSIS — R7989 Other specified abnormal findings of blood chemistry: Secondary | ICD-10-CM | POA: Insufficient documentation

## 2023-09-15 DIAGNOSIS — R748 Abnormal levels of other serum enzymes: Secondary | ICD-10-CM | POA: Diagnosis not present

## 2023-09-15 LAB — CBC WITH DIFFERENTIAL/PLATELET
Basophils Absolute: 0.1 10*3/uL (ref 0.0–0.2)
Basos: 0 %
EOS (ABSOLUTE): 0.3 10*3/uL (ref 0.0–0.4)
Eos: 3 %
Hematocrit: 46.3 % (ref 37.5–51.0)
Hemoglobin: 15.1 g/dL (ref 13.0–17.7)
Immature Grans (Abs): 0 10*3/uL (ref 0.0–0.1)
Immature Granulocytes: 0 %
Lymphocytes Absolute: 2.1 10*3/uL (ref 0.7–3.1)
Lymphs: 17 %
MCH: 29.7 pg (ref 26.6–33.0)
MCHC: 32.6 g/dL (ref 31.5–35.7)
MCV: 91 fL (ref 79–97)
Monocytes Absolute: 0.7 10*3/uL (ref 0.1–0.9)
Monocytes: 6 %
Neutrophils Absolute: 8.9 10*3/uL — ABNORMAL HIGH (ref 1.4–7.0)
Neutrophils: 74 %
Platelets: 313 10*3/uL (ref 150–450)
RBC: 5.08 x10E6/uL (ref 4.14–5.80)
RDW: 12.3 % (ref 11.6–15.4)
WBC: 12.1 10*3/uL — ABNORMAL HIGH (ref 3.4–10.8)

## 2023-09-15 LAB — COMPREHENSIVE METABOLIC PANEL
ALT: 47 [IU]/L — ABNORMAL HIGH (ref 0–44)
AST: 34 [IU]/L (ref 0–40)
Albumin: 4.3 g/dL (ref 4.1–5.1)
Alkaline Phosphatase: 133 [IU]/L — ABNORMAL HIGH (ref 44–121)
BUN/Creatinine Ratio: 9 (ref 9–20)
BUN: 13 mg/dL (ref 6–24)
Bilirubin Total: 0.2 mg/dL (ref 0.0–1.2)
CO2: 23 mmol/L (ref 20–29)
Calcium: 9.7 mg/dL (ref 8.7–10.2)
Chloride: 101 mmol/L (ref 96–106)
Creatinine, Ser: 1.38 mg/dL — ABNORMAL HIGH (ref 0.76–1.27)
Globulin, Total: 2.6 g/dL (ref 1.5–4.5)
Glucose: 85 mg/dL (ref 70–99)
Potassium: 4.3 mmol/L (ref 3.5–5.2)
Sodium: 141 mmol/L (ref 134–144)
Total Protein: 6.9 g/dL (ref 6.0–8.5)
eGFR: 62 mL/min/{1.73_m2} (ref >59)

## 2023-09-15 MED ORDER — EPINEPHRINE 0.3 MG/0.3ML IJ SOAJ: 0.3000 mg | INTRAMUSCULAR | 1 refills | Status: AC | PRN

## 2023-09-15 NOTE — Progress Notes (Signed)
   Subjective:    Patient ID: Mark Macias, male    DOB: [birth year], 53 y.o.   MRN: [id number]  HPI He is here for a follow-up visit after recent evaluation here which did show elevated liver enzymes.  At that time he was having GI related symptoms of nausea and vomiting.  Blood work did show elevated liver enzymes and subsequent evaluation for hepatitis was negative.  Presently he is now back to his normal health.   Review of Systems     Objective:    Physical Exam  Alert and in no distress. Tympanic membranes and canals are normal. Pharyngeal area is normal. Neck is supple without adenopathy or thyromegaly. Cardiac exam shows a regular sinus rhythm without murmurs or gallops. Lungs are clear to auscultation.       Assessment & Plan:  Elevated liver enzymes - Plan: CBC with Differential/Platelet, Comprehensive metabolic panel I suspect that he had a viral illness that caused is slight elevation of his enzymes and hopefully will return to normal.

## 2023-09-16 LAB — HCV INTERPRETATION

## 2023-09-16 LAB — ACUTE VIRAL HEPATITIS (HAV, HBV, HCV)
HCV Ab: NONREACTIVE
Hep A IgM: NEGATIVE
Hep B C IgM: NEGATIVE
Hepatitis B Surface Ag: NEGATIVE

## 2023-09-16 LAB — SPECIMEN STATUS REPORT

## 2023-09-16 NOTE — Progress Notes (Signed)
 Fortunately hepatitis labs show no hepatitis B C or a infection.  Follow-up with Dr. Susann Givens for further discussion

## 2023-11-03 ENCOUNTER — Encounter: Payer: Self-pay | Admitting: Internal Medicine

## 2024-01-13 ENCOUNTER — Ambulatory Visit: Payer: Self-pay

## 2024-01-13 NOTE — Telephone Encounter (Signed)
 Copied from CRM [id number]. Topic: Clinical - Red Word Triage >> Jan 13, 2024  9:39 AM Donald Frost wrote: Red Word that prompted transfer to Nurse Triage: The patient called in stating since yesterday he has not been steady and he also complains of a cough and some shortness of breath. He states his stepfather had double pneumonia last week. I will transfer him to E2C2 NT  Chief Complaint: cough, green sputum, sob, wheezing Symptoms: see above Frequency: yesterday started Pertinent Negatives: Patient denies fever, cp Disposition: [] ED /[x] Urgent Care (no appt availability in office) / [] Appointment(In office/virtual)/ []  [facility]/ [] Home Care/ [] Refused Recommended Disposition /[] [facility]/ []  Follow-up with PCP Additional Notes: no apt available; instructed to go to [facility]. Care advice given, denies questions; instructed to go to ER if becomes worse.   Reason for Disposition  [1] MILD difficulty breathing (e.g., minimal/no SOB at rest, SOB with walking, pulse <100) AND [2] still present when not coughing  Answer Assessment - Initial Assessment Questions 1. ONSET: "When did the cough begin?"      yesterday 2. SEVERITY: "How bad is the cough today?"      moderate 3. SPUTUM: "Describe the color of your sputum" (none, dry cough; clear, white, yellow, green)     green 4. HEMOPTYSIS: "Are you coughing up any blood?" If so ask: "How much?" (flecks, streaks, tablespoons, etc.)     no 5. DIFFICULTY BREATHING: "Are you having difficulty breathing?" If Yes, ask: "How bad is it?" (e.g., mild, moderate, severe)    - MILD: No SOB at rest, mild SOB with walking, speaks normally in sentences, can lie down, no retractions, pulse < 100.    - MODERATE: SOB at rest, SOB with minimal exertion and prefers to sit, cannot lie down flat, speaks in phrases, mild retractions, audible wheezing, pulse 100-120.    - SEVERE: Very SOB at rest, speaks in single words, struggling to breathe, sitting  hunched forward, retractions, pulse > 120      mild 6. FEVER: "Do you have a fever?" If Yes, ask: "What is your temperature, how was it measured, and when did it start?"     no 7. CARDIAC HISTORY: "Do you have any history of heart disease?" (e.g., heart attack, congestive heart failure)      no 8. LUNG HISTORY: "Do you have any history of lung disease?"  (e.g., pulmonary embolus, asthma, emphysema)     no 9. PE RISK FACTORS: "Do you have a history of blood clots?" (or: recent major surgery, recent prolonged travel, bedridden)     no 10. OTHER SYMPTOMS: "Do you have any other symptoms?" (e.g., runny nose, wheezing, chest pain)       wheezing 11. PREGNANCY: "Is there any chance you are pregnant?" "When was your last menstrual period?"       na 12. TRAVEL: "Have you traveled out of the country in the last month?" (e.g., travel history, exposures)       no  Protocols used: Cough - Acute Productive-A-AH

## 2024-01-15 ENCOUNTER — Encounter: Payer: Self-pay | Admitting: Medical

## 2024-01-15 ENCOUNTER — Ambulatory Visit (INDEPENDENT_AMBULATORY_CARE_PROVIDER_SITE_OTHER): Admitting: Medical

## 2024-01-15 VITALS — BP 130/90 | HR 59 | Wt 170.0 lb

## 2024-01-15 DIAGNOSIS — R058 Other specified cough: Secondary | ICD-10-CM

## 2024-01-15 DIAGNOSIS — F17200 Nicotine dependence, unspecified, uncomplicated: Secondary | ICD-10-CM

## 2024-01-15 DIAGNOSIS — R030 Elevated blood-pressure reading, without diagnosis of hypertension: Secondary | ICD-10-CM

## 2024-01-15 DIAGNOSIS — Z2089 Contact with and (suspected) exposure to other communicable diseases: Secondary | ICD-10-CM

## 2024-01-15 DIAGNOSIS — J988 Other specified respiratory disorders: Secondary | ICD-10-CM

## 2024-01-15 MED ORDER — DM-GUAIFENESIN ER 30-600 MG PO TB12: 1.0000 | ORAL_TABLET | Freq: Two times a day (BID) | ORAL | 0 refills | Status: DC

## 2024-01-15 MED ORDER — AZITHROMYCIN 250 MG PO TABS: ORAL_TABLET | ORAL | 0 refills | Status: DC

## 2024-01-15 MED ORDER — ALBUTEROL SULFATE HFA 108 (90 BASE) MCG/ACT IN AERS: 2.0000 | INHALATION_SPRAY | Freq: Four times a day (QID) | RESPIRATORY_TRACT | 0 refills | Status: DC | PRN

## 2024-01-15 NOTE — Progress Notes (Signed)
 Subjective:  Mark Macias is a 53 y.o. male who presents for Chief Complaint  Patient presents with   Acute Visit    Cough and congestion. Still has place in his left foot that will not go away has talk to Dr. Robina Macias about it.      Here for c/o cough and congestion.   Having symptoms for about 2 weeks.  Feels a little SOB and wheezing.   No fever, no body aches or chills.  No sore throat, no ear pain.  Has some sinus pressure.  Having some colored mucous, Civil when blowing nose.  He swallows the sputum so not sure about the color.   Using nothing OTC currently for symptoms.  Water intake is good.    His step father 3 weeks ago had double pneumonia.  Thinks he may have gotten sick from him.    He is a smoker, about 1 pack per week.    No other aggravating or relieving factors.    No other c/o.  Past Medical History:  Diagnosis Date   Allergy    RHINITIS   Allergy to bee sting    Left-sided Bell's palsy    Mood disorder (HCC)    Periumbilical hernia    Smoker    Current Outpatient Medications on File Prior to Visit  Medication Sig Dispense Refill   ARIPiprazole (ABILIFY) 5 MG tablet Take 5 mg by mouth at bedtime.  1   clonazePAM (KLONOPIN) 1 MG tablet TAKE 1/2 TABLET (0.5 MG) BY MOUTH DAILY AT BEDTIME     EPINEPHrine  0.3 mg/0.3 mL IJ SOAJ injection Inject 0.3 mg into the muscle as needed for anaphylaxis. 2 each 1   hydrOXYzine (VISTARIL) 25 MG capsule Take 25 mg by mouth 3 (three) times daily as needed. Pt takes 2 pills at night     cetirizine (ZYRTEC) 10 MG tablet Take 10 mg by mouth daily. (Patient not taking: Reported on 01/15/2024)     FLUoxetine (PROZAC) 20 MG tablet Take 60 mg by mouth daily. (Patient not taking: Reported on 01/15/2024)     ondansetron  (ZOFRAN -ODT) 4 MG disintegrating tablet Take 1 tablet (4 mg total) by mouth every 8 (eight) hours as needed for nausea or vomiting. (Patient not taking: Reported on 01/15/2024) 20 tablet 0   No current facility-administered  medications on file prior to visit.     The following portions of the patient's history were reviewed and updated as appropriate: allergies, current medications, past family history, past medical history, past social history, past surgical history and problem list.  ROS Otherwise as in subjective above  Objective: BP (!) 130/90   Pulse (!) 59   Wt 170 lb (77.1 kg)   SpO2 98%   BMI 24.05 kg/m   Wt Readings from Last 3 Encounters:  01/15/24 170 lb (77.1 kg)  09/15/23 177 lb 3.2 oz (80.4 kg)  09/08/23 166 lb 3.2 oz (75.4 kg)   BP Readings from Last 3 Encounters:  01/15/24 (!) 130/90  09/15/23 124/76  09/08/23 118/72    General appearance: alert, no distress, well developed, well nourished HEENT: normocephalic, sclerae anicteric, conjunctiva pink and moist, TMs with scar tissue and some retraction bilat, nares with mild turbinated edema and mild mucoid discharge, + erythema, pharynx normal Oral cavity: MMM, no lesions Neck: supple, no lymphadenopathy, no thyromegaly, no masses Heart: RRR, normal S1, S2, no murmurs Lungs: Somewhat coarse breath sounds, little dullness in the right lower fields, otherwise clear Ext: no edema  Assessment: Encounter Diagnoses  Name Primary?   Cough productive of purulent sputum Yes   Respiratory tract infection    Exposure to pneumonia    Smoker    Elevated blood pressure reading without diagnosis of hypertension      Plan: Given the timeframe of your symptoms and lung sounds I am going to put you on some treatment as below.  Recommendations: Drink at least 80 to 100 ounces of water daily Complete the course of Z-Pak antibiotic You can use Mucinex  DM twice daily for the next 5 days to help with cough and mucus Use the inhaler Albuterol , 2 puffs 2 or 3 times a day to help open up the air sacs in the lungs, to help with shortness of breath and wheezing, and help with cough and to help get mucus out.  Caution, you will feel little jittery  after using the medication. If not significantly improved by Monday or Tuesday then let me know  Your blood pressure is little elevated today.  Monitor your blood pressure at home if you have a cuff.  Normal is 120/70.  Usually your blood pressure is good.  Follow-up with Dr. Robina Macias or consider coming back for nurse visit blood pressure check in a month    Mark Macias was seen today for acute visit.  Diagnoses and all orders for this visit:  Cough productive of purulent sputum  Respiratory tract infection  Exposure to pneumonia  Smoker  Elevated blood pressure reading without diagnosis of hypertension  Other orders -     azithromycin  (ZITHROMAX ) 250 MG tablet; 2 tablets day 1, then 1 tablet days 2-4 -     albuterol  (VENTOLIN  HFA) 108 (90 Base) MCG/ACT inhaler; Inhale 2 puffs into the lungs every 6 (six) hours as needed for wheezing or shortness of breath. -     dextromethorphan-guaiFENesin  (MUCINEX  DM) 30-600 MG 12hr tablet; Take 1 tablet by mouth 2 (two) times daily.    Follow up: with PCP

## 2024-01-15 NOTE — Patient Instructions (Signed)
 Given the timeframe of your symptoms and lung sounds I am going to put you on some treatment as below.  Recommendations: Drink at least 80 to 100 ounces of water daily Complete the course of Z-Pak antibiotic You can use Mucinex  DM twice daily for the next 5 days to help with cough and mucus Use the inhaler Albuterol , 2 puffs 2 or 3 times a day to help open up the air sacs in the lungs, to help with shortness of breath and wheezing, and help with cough and to help get mucus out.  Caution, you will feel little jittery after using the medication. If not significantly improved by Monday or Tuesday then let me know  Your blood pressure is little elevated today.  Monitor your blood pressure at home if you have a cuff.  Normal is 120/70.  Usually your blood pressure is good.  Follow-up with Dr. Robina Chol or consider coming back for nurse visit blood pressure check in a month

## 2024-05-23 ENCOUNTER — Encounter: Payer: Self-pay | Admitting: Family Medicine

## 2024-05-23 ENCOUNTER — Ambulatory Visit (INDEPENDENT_AMBULATORY_CARE_PROVIDER_SITE_OTHER): Payer: Self-pay | Admitting: Family Medicine

## 2024-05-23 VITALS — BP 124/82 | HR 73 | Temp 98.7°F | Wt 173.6 lb

## 2024-05-23 DIAGNOSIS — F17200 Nicotine dependence, unspecified, uncomplicated: Secondary | ICD-10-CM

## 2024-05-23 DIAGNOSIS — J069 Acute upper respiratory infection, unspecified: Secondary | ICD-10-CM

## 2024-05-23 LAB — POC COVID19/FLU A&B COMBO
Covid Antigen, POC: NEGATIVE
Influenza A Antigen, POC: NEGATIVE
Influenza B Antigen, POC: NEGATIVE

## 2024-05-23 MED ORDER — PREDNISONE 20 MG PO TABS: 40.0000 mg | ORAL_TABLET | Freq: Every day | ORAL | 0 refills | Status: AC

## 2024-05-23 NOTE — Progress Notes (Addendum)
   Name: Mark Macias   Date of Visit: 05/23/24   Date of last visit with me: Visit date not found   CHIEF COMPLAINT:  Chief Complaint  Patient presents with   Acute Visit    Cold sinus symptoms, chills, cough, runny nose, body ache.        HPI:   History of Present Illness  Patient is presenting for 1 week history of cough, congestion in the chest as well as some chills.  Patient notes that his symptoms are been going on since Tuesday of last week.  Patient notes he been sick contacts or any other exposures.  Patient does smoke but states that he smokes about 6 cigarettes a week.  Patient has been using his inhaler over the last 2 days and does note some mild improvement with inhaler usage.  Patient otherwise has no other concerns at this time.    OBJECTIVE:       08/14/2021   11:55 AM  Depression screen PHQ 2/9  Decreased Interest 0  Down, Depressed, Hopeless 0  PHQ - 2 Score 0     BP Readings from Last 3 Encounters:  05/23/24 124/82  01/15/24 (!) 130/90  09/15/23 124/76    BP 124/82   Pulse 73   Temp 98.7 F (37.1 C)   Wt 173 lb 9.6 oz (78.7 kg)   SpO2 97%   BMI 24.56 kg/m    Physical Exam   Physical Exam Constitutional:      General: He is not in acute distress.    Appearance: Normal appearance. He is not toxic-appearing.  Cardiovascular:     Rate and Rhythm: Normal rate and regular rhythm.     Pulses: Normal pulses.     Heart sounds: Normal heart sounds.  Pulmonary:     Effort: No respiratory distress.     Breath sounds: No stridor. Wheezing (Bilateral expiratory wheeze) present. No rhonchi or rales.  Neurological:     Mental Status: He is alert.     ASSESSMENT/PLAN:   Assessment & Plan Viral URI with cough  Smoker    Assessment and Plan Assessment & Plan   - COVID test were negative, discussed with patient that at this time there is no noted wheezing on his exam and he likely is dealing with some reactive airway disease.  We will go  ahead and treat with prednisone  for the next 7 days. - Reviewed patient's previous labs which do not show any contraindications to taking medications. - Consider x-ray if no improvement in 1 week and can add Z-Pak at that time if necessary. - Advised to decrease smoking   Matilde Pottenger A. Vita MD [facility]

## 2024-05-31 ENCOUNTER — Ambulatory Visit (INDEPENDENT_AMBULATORY_CARE_PROVIDER_SITE_OTHER): Payer: Self-pay | Admitting: Medical

## 2024-05-31 ENCOUNTER — Encounter: Payer: Self-pay | Admitting: Medical

## 2024-05-31 VITALS — BP 130/80 | HR 68 | Temp 97.3°F | Ht 70.0 in | Wt 174.8 lb

## 2024-05-31 DIAGNOSIS — R062 Wheezing: Secondary | ICD-10-CM

## 2024-05-31 DIAGNOSIS — J988 Other specified respiratory disorders: Secondary | ICD-10-CM

## 2024-05-31 DIAGNOSIS — R052 Subacute cough: Secondary | ICD-10-CM

## 2024-05-31 MED ORDER — HYDROCODONE BIT-HOMATROP MBR 5-1.5 MG/5ML PO SOLN: 5.0000 mL | Freq: Three times a day (TID) | ORAL | 0 refills | Status: AC | PRN

## 2024-05-31 MED ORDER — DOXYCYCLINE HYCLATE 100 MG PO TABS: 100.0000 mg | ORAL_TABLET | Freq: Two times a day (BID) | ORAL | 0 refills | Status: AC

## 2024-05-31 MED ORDER — ALBUTEROL SULFATE HFA 108 (90 BASE) MCG/ACT IN AERS: 2.0000 | INHALATION_SPRAY | Freq: Four times a day (QID) | RESPIRATORY_TRACT | 1 refills | Status: AC | PRN

## 2024-05-31 NOTE — Progress Notes (Signed)
 Subjective: Chief Complaint  Patient presents with   Cough    Chest congestion and cough to the point where he can't sleep at night. Took the prednisone  that Dr. Vita gave him last week 05/23/24.    History of Present Illness Mark Macias is a 53 year old male who presents with persistent chest congestion, wheezing, and cough.  He was seen here 05/23/24 for same.   He has been experiencing chest congestion, wheezing, and cough for over a week. Initially, he had a negative COVID test and was prescribed prednisone , which he has completed, but there has been no significant improvement in symptoms. He continues to experience congestion in the chest, wheezing, coughing, and a runny nose. No fever, productive cough, or sinus pain.  He has been using albuterol  three times a day to manage wheezing. He also uses Robitussin and honey for chest congestion.   His ears are stopped up, a symptom he experiences every time he has these respiratory issues.  He continues to smoke but has cut down significantly. He has been using the inhaler more frequently in the last week or two.  No other aggravating or relieving factors. No other complaint.   Past Medical History:  Diagnosis Date   Allergy    RHINITIS   Allergy to bee sting    Left-sided Bell's palsy    Mood disorder    Periumbilical hernia    Smoker    Current Outpatient Medications on File Prior to Visit  Medication Sig Dispense Refill   ARIPiprazole (ABILIFY) 5 MG tablet Take 5 mg by mouth at bedtime.  1   clonazePAM (KLONOPIN) 1 MG tablet TAKE 1/2 TABLET (0.5 MG) BY MOUTH DAILY AT BEDTIME     FLUoxetine (PROZAC) 40 MG capsule Take 40 mg by mouth daily.     traZODone (DESYREL) 100 MG tablet Take by mouth.     EPINEPHrine  0.3 mg/0.3 mL IJ SOAJ injection Inject 0.3 mg into the muscle as needed for anaphylaxis. (Patient not taking: Reported on 05/31/2024) 2 each 1   ondansetron  (ZOFRAN -ODT) 4 MG disintegrating tablet Take 1 tablet (4 mg total) by  mouth every 8 (eight) hours as needed for nausea or vomiting. (Patient not taking: Reported on 05/23/2024) 20 tablet 0   No current facility-administered medications on file prior to visit.   ROS as in subjective   Objective: BP 130/80   Pulse 68   Temp (!) 97.3 F (36.3 C) (Tympanic)   Ht 5' 10 (1.778 m)   Wt 174 lb 12.8 oz (79.3 kg)   SpO2 97%   BMI 25.08 kg/m   General appearence: alert, no distress, WD/WN,  HEENT: normocephalic, sclerae anicteric, TMs with mild over edema and retraction, scar tissue present as well, nares with erythema and mucoid discharge, pharynx with some erythema but no exudate Oral cavity: MMM, no lesions Neck: supple, no lymphadenopathy, no thyromegaly, no masses Heart: RRR, normal S1, S2, no murmurs Lungs: Some coarse sounds but no wheezes, no rhonchi, or rales Ext: no edema Pulses: 2+ symmetric, upper and lower extremities, normal cap refill    Assessment and Plan Encounter Diagnoses  Name Primary?   Respiratory tract infection Yes   Wheezing    Subacute cough      Persistent chest congestion, wheezing, and cough despite prednisone . No fever or productive cough. Possible bacterial involvement. - Prescribed doxycycline  100 mg twice daily for 7 days. - Prescribed albuterol  inhaler refill. - Prescribed Hycodan for symptomatic relief, cautioned about drowsiness. -  Advised return if symptoms worsen or persist in a week, consider chest x-ray or blood work.  Tobacco use disorder Continues smoking, reduced amount. Smoking may exacerbate respiratory symptoms and delay bronchitis recovery.    [name] was seen today for cough.  Diagnoses and all orders for this visit:  Respiratory tract infection  Wheezing  Subacute cough  Other orders -     albuterol  (VENTOLIN  HFA) 108 (90 Base) MCG/ACT inhaler; Inhale 2 puffs into the lungs every 6 (six) hours as needed for wheezing or shortness of breath. -     doxycycline  (VIBRA -TABS) 100 MG tablet; Take  1 tablet (100 mg total) by mouth 2 (two) times daily. -     HYDROcodone  bit-homatropine (HYCODAN) 5-1.5 MG/5ML syrup; Take 5 mLs by mouth every 8 (eight) hours as needed for cough.    F/u prn

## 2024-06-01 ENCOUNTER — Ambulatory Visit: Payer: Self-pay | Admitting: Family Medicine

## 2024-06-02 ENCOUNTER — Telehealth: Payer: Self-pay

## 2024-06-02 NOTE — Telephone Encounter (Signed)
 Copied from CRM [id number]. Topic: General - Other >> Jun 02, 2024  2:44 PM Edsel HERO wrote: Patient states that he was seen in office on 9/23 for a respiratory infection and had to miss work today (9/25). Patient is requesting a work note for today. Please advise.

## 2024-06-03 ENCOUNTER — Encounter: Payer: Self-pay | Admitting: Family Medicine

## 2024-06-03 NOTE — Telephone Encounter (Signed)
 Out of work letter typed & called pt & he states he is no better, still coughing a lot, cough kept him up at night about every hour.  Taking cough medicine doesn't seen to help much.  Wasn't able to go back to work today.  Would like note to be out of work today also.  Is this okay?

## 2024-06-06 NOTE — Telephone Encounter (Signed)
 New letter typed with out of work for both dates, pt informed

## 2024-06-28 ENCOUNTER — Ambulatory Visit: Payer: Self-pay

## 2024-06-28 NOTE — Telephone Encounter (Signed)
 FYI Only or Action Required?: Action required by provider: clinical question for provider, update on patient condition, and patient requests for ear drops to be called into pharmacy.  Patient was last seen in primary care on 05/31/2024 by Bulah Alm RAMAN, PA-C.  Called Nurse Triage reporting Ear Fullness.  Symptoms began a week ago.  Interventions attempted: Nothing.  Symptoms are: gradually worsening.  Triage Disposition: See PCP When Office is Open (Within 3 Days)  Patient/caregiver understands and will follow disposition?: Yes  Copied from CRM [id number]. Topic: Clinical - Red Word Triage >> Jun 28, 2024  4:47 PM Roselie BROCKS wrote: Red Word that prompted transfer to Nurse Triage: Patient states he has pain in his right ear, and completely stopped up, can't hear at all out of it, he can feel the fluid moving around inside it. Reason for Disposition  [1] Ear congestion lasts > 3 days AND [2] no improvement after using Care Advice  (Exception: Ear congestion is a chronic symptom.)  Answer Assessment - Initial Assessment Questions Before scheduling, patient would like to know if provider would be willing to call in ear drops as they have done in the past  1. LOCATION: Which ear is involved?       Both ears 2. SENSATION: Describe how the ear feels. (e.g., stuffy, full, plugged).      Ears feel full 3. ONSET:  When did the ear symptoms start?       Two weeks 4. PAIN: Do you also have an earache? If Yes, ask: How bad is it? (Scale 0-10; none, mild, moderate or severe)     denies 5. CAUSE: What do you think is causing the ear congestion? (e.g., common cold, nasal allergies, recent flight, recent snorkeling)     Recent URI 6. OTHER SYMPTOMS: Do you have any other symptoms? (e.g., ear drainage, hay fever symptoms such as sneezing or a clear nasal discharge; cold symptoms such as a cough or runny nose)     Difficulty hearing 7. PREGNANCY: Is there any chance you are  pregnant? When was your last menstrual period?     N/a  Protocols used: Ear - Congestion-A-AH

## 2024-06-29 NOTE — Telephone Encounter (Signed)
 Left message for pt

## 2024-06-29 NOTE — Telephone Encounter (Signed)
 Pt needs an appt for ear pain

## 2024-06-30 ENCOUNTER — Encounter: Payer: Self-pay | Admitting: Family Medicine

## 2024-06-30 ENCOUNTER — Ambulatory Visit (INDEPENDENT_AMBULATORY_CARE_PROVIDER_SITE_OTHER): Payer: Self-pay | Admitting: Family Medicine

## 2024-06-30 VITALS — BP 122/88 | HR 71 | Wt 170.8 lb

## 2024-06-30 DIAGNOSIS — J309 Allergic rhinitis, unspecified: Secondary | ICD-10-CM | POA: Diagnosis not present

## 2024-06-30 DIAGNOSIS — H9193 Unspecified hearing loss, bilateral: Secondary | ICD-10-CM | POA: Diagnosis not present

## 2024-06-30 MED ORDER — FLUTICASONE PROPIONATE 50 MCG/ACT NA SUSP: 2.0000 | Freq: Every day | NASAL | 6 refills | Status: AC

## 2024-06-30 MED ORDER — AZELASTINE HCL 0.1 % NA SOLN: 1.0000 | Freq: Two times a day (BID) | NASAL | 12 refills | Status: AC

## 2024-06-30 MED ORDER — LORATADINE 10 MG PO TABS: 10.0000 mg | ORAL_TABLET | Freq: Every day | ORAL | 11 refills | Status: AC

## 2024-06-30 NOTE — Progress Notes (Signed)
   Name: TRAMELL PIECHOTA   Date of Visit: 06/30/24   Date of last visit with me: 05/23/2024   CHIEF COMPLAINT:  Chief Complaint  Patient presents with   Acute Visit    Both ears stopped up. Right is worse than left.        HPI:  Discussed the use of AI scribe software for clinical note transcription with the patient, who gave verbal consent to proceed.  History of Present Illness   CASE VASSELL is a 53 year old male who presents with persistent ear congestion.  He has been experiencing ear congestion for the past two to three weeks, with the right ear being more affected than the left. There is no associated pain or itchiness, but he notes a sensation of fluid behind the ear.  He has a history of similar episodes in the past, for which he was prescribed ear drops. A few weeks ago, he was ill and was prescribed doxycycline , which alleviated his symptoms at that time. He has not used any over-the-counter ear drops recently.  He has a known history of allergies, which are currently untreated. He reports that his sinuses feel very clogged. No ear pain or itchiness is reported, and he has not used any over-the-counter ear drops.         OBJECTIVE:       08/14/2021   11:55 AM  Depression screen PHQ 2/9  Decreased Interest 0  Down, Depressed, Hopeless 0  PHQ - 2 Score 0     BP Readings from Last 3 Encounters:  06/30/24 122/88  05/31/24 130/80  05/23/24 124/82    BP 122/88   Pulse 71   Wt 170 lb 12.8 oz (77.5 kg)   SpO2 98%   BMI 24.51 kg/m    Physical Exam   HEENT: Fluid behind right ear. No cerumen in ears. Sinuses congested.      Physical Exam HENT:     Right Ear: External ear normal. A middle ear effusion is present. There is no impacted cerumen. Tympanic membrane is not perforated, erythematous, retracted or bulging. Tympanic membrane has normal mobility.     Left Ear: Ear canal and external ear normal. A middle ear effusion is present. Tympanic membrane is not  perforated, erythematous, retracted or bulging. Tympanic membrane has normal mobility.     Nose: Rhinorrhea present. No congestion.  Cardiovascular:     Rate and Rhythm: Normal rate and regular rhythm.  Neurological:     Mental Status: He is alert.     ASSESSMENT/PLAN:   Assessment & Plan Allergic rhinitis, unspecified seasonality, unspecified trigger  Hearing decreased, bilateral    Assessment and Plan    Chronic allergic rhinitis with nasal and eustachian tube congestion Chronic allergic rhinitis with significant nasal and eustachian tube congestion for three weeks. Improvement with doxycycline . Likely due to severe sinus congestion from allergies. Fluid behind ear due to sinus congestion. - Prescribed Flonase  nasal spray, two sprays each nostril at night. - Prescribed Astelin  nasal spray, morning and evening use. - Recommended daily Claritin for allergy prevention. - Advised spicy foods for nasal drainage. - Recommended humidifier in bedroom to reduce congestion. - Sent prescriptions to [facility] pharmacy.         Aireana Ryland A. Vita MD [facility]

## 2024-08-11 ENCOUNTER — Ambulatory Visit: Payer: Self-pay | Admitting: Family Medicine

## 2024-08-26 ENCOUNTER — Encounter: Payer: Self-pay | Admitting: Family Medicine
